# Patient Record
Sex: Female | Born: 1995 | Race: Black or African American | Hispanic: No | Marital: Single | Smoking: Former smoker
Health system: Southern US, Community
[De-identification: ages and names within clinical notes are randomized; demographics above are authoritative.]

## PROBLEM LIST (undated history)

## (undated) DIAGNOSIS — T7840XA Allergy, unspecified, initial encounter: Secondary | ICD-10-CM

## (undated) HISTORY — DX: Allergy, unspecified, initial encounter: T78.40XA

## (undated) HISTORY — PX: WISDOM TOOTH EXTRACTION: SHX21

---
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
## 2015-03-25 ENCOUNTER — Encounter (HOSPITAL_COMMUNITY): Payer: Self-pay

## 2015-03-25 ENCOUNTER — Other Ambulatory Visit (HOSPITAL_COMMUNITY)
Admission: RE | Admit: 2015-03-25 | Discharge: 2015-03-25 | Disposition: A | Discharge status: Home / Self Care | Payer: Medicaid Other | Source: Ambulatory Visit | Attending: Family Medicine | Admitting: Family Medicine

## 2015-03-25 ENCOUNTER — Emergency Department (INDEPENDENT_AMBULATORY_CARE_PROVIDER_SITE_OTHER)
Admission: EM | Admit: 2015-03-25 | Discharge: 2015-03-25 | Disposition: A | Discharge status: Home / Self Care | Payer: Medicaid Other | Source: Home / Self Care

## 2015-03-25 DIAGNOSIS — N760 Acute vaginitis: Secondary | ICD-10-CM | POA: Insufficient documentation

## 2015-03-25 DIAGNOSIS — Z113 Encounter for screening for infections with a predominantly sexual mode of transmission: Secondary | ICD-10-CM | POA: Insufficient documentation

## 2015-03-25 LAB — POCT URINALYSIS DIP (DEVICE)
Bilirubin Urine: NEGATIVE
Glucose, UA: NEGATIVE mg/dL
HGB URINE DIPSTICK: NEGATIVE
Ketones, ur: NEGATIVE mg/dL
NITRITE: NEGATIVE
PH: 6.5 (ref 5.0–8.0)
Protein, ur: NEGATIVE mg/dL
SPECIFIC GRAVITY, URINE: 1.02 (ref 1.005–1.030)
Urobilinogen, UA: 1 mg/dL (ref 0.0–1.0)

## 2015-03-25 LAB — POCT PREGNANCY, URINE: Preg Test, Ur: NEGATIVE

## 2015-03-25 MED ORDER — METRONIDAZOLE 500 MG PO TABS
500.0000 mg | ORAL_TABLET | Freq: Three times a day (TID) | ORAL | Status: DC
Start: 2015-03-25 — End: 2015-05-09

## 2015-03-25 NOTE — Discharge Instructions (Signed)

## 2015-03-25 NOTE — ED Notes (Signed)
Reported history of BV, " I think I have BV again". C/o vaginal d/c , itching. [facility]

## 2015-03-26 LAB — GC/CHLAMYDIA PROBE AMP (~~LOC~~) NOT AT ARMC
CHLAMYDIA, DNA PROBE: POSITIVE — AB
Neisseria Gonorrhea: NEGATIVE
Trichomonas: NEGATIVE

## 2015-03-26 LAB — CERVICOVAGINAL ANCILLARY ONLY: WET PREP (BD AFFIRM): POSITIVE — AB

## 2015-03-26 LAB — RPR: RPR: NONREACTIVE

## 2015-03-26 LAB — HIV ANTIBODY (ROUTINE TESTING W REFLEX): HIV Screen 4th Generation wRfx: NONREACTIVE

## 2015-03-26 NOTE — ED Notes (Signed)
Final report of HIV and RPR negative. Still waiting on GC, chlamydia reports

## 2015-03-26 NOTE — ED Notes (Addendum)
Final report of GC , chlamydia available for review. Positive for chlamydia, negative for GC.positive for gardnerella. Treatment for gardnerella adequate w rx provided day of [facility] visit Discussed w Dr Randal BubaErin Honig, who authorized Azithromycin 1 GM as a 1 x dose. Called patient , and after verifying ID discussed findings. Was advised she needs to contact her partner so they may also be treated and to avoid unprotected sex x 1 week after both have been treated to avoid re infection . Called to [facility] at corner of [address] [place] at patinet request. Spoke directly w pharmacy staff, form 2124 [employer] [facility]

## 2015-03-26 NOTE — ED Provider Notes (Signed)
CSN: [id number]     Arrival date & time 03/25/15  1303 History   None    Chief Complaint  Patient presents with  . Vaginitis   (Consider location/radiation/quality/duration/timing/severity/associated sxs/prior Treatment) HPI 20 year old female complaining of vaginal discharge. She states that she also has quite a bit of itching in the area. The patient's reported no previous history of STD in the past. States she may have been exposed to STD but has discharge similar to BV infection of the past. Past Medical History  Diagnosis Date  . Allergy    History reviewed. No pertinent past surgical history. Family History  Problem Relation Age of Onset  . Cancer Maternal Grandmother    Social History  Substance Use Topics  . Smoking status: Never Smoker   . Smokeless tobacco: Never Used  . Alcohol Use: 0.0 oz/week    0 Standard drinks or equivalent per week     Comment: occasional   OB History    Gravida Para Term Preterm AB TAB SAB Ectopic Multiple Living       Review of Systems ROS +'ve vaginal discharge  Denies: HEADACHE, NAUSEA, ABDOMINAL PAIN, CHEST PAIN, CONGESTION, DYSURIA, SHORTNESS OF BREATH  Allergies  Review of patient's allergies indicates no known allergies.  Home Medications   Prior to Admission medications   Medication Sig Start Date End Date Taking? Authorizing Provider  diphenhydrAMINE (SOMINEX) 25 MG tablet Take 25 mg by mouth at bedtime as needed for sleep.    Historical Provider, MD  ibuprofen (ADVIL,MOTRIN) 200 MG tablet Take 400 mg by mouth every 6 (six) hours as needed. FOR PAIN    Historical Provider, MD  Levonorgestrel (SKYLA) 13.5 MG IUD by Intrauterine route.    Historical Provider, MD  metroNIDAZOLE (FLAGYL) 500 MG tablet Take 1 tablet (500 mg total) by mouth 3 (three) times daily. 03/25/15   Tharon Aquas, PA  Multiple Vitamin (MULTIVITAMIN WITH MINERALS) TABS tablet Take 1 tablet by mouth daily.    Historical Provider, MD   norethindrone-ethinyl estradiol 1/35 (ORTHO-NOVUM 1/35, 28,) tablet Take 1 tablet by mouth daily. Patient not taking: Reported on 11/02/2014 09/13/14 09/13/15  Roe Coombs, CNM   Meds Ordered and Administered this Visit  Medications - No data to display  BP 123/81 mmHg  Pulse 67  Temp(Src) 98.1 F (36.7 C) (Oral)  Resp 18  SpO2 100% No data found.   Physical Exam  Constitutional: She appears well-developed and well-nourished.  HENT:  Head: Normocephalic and atraumatic.  Eyes: Conjunctivae are normal.  Pulmonary/Chest: Effort normal.  Abdominal: Soft. Bowel sounds are normal.  Genitourinary: Vaginal discharge found.  malodorous  Skin: Skin is warm and dry.  Psychiatric: She has a normal mood and affect. Her behavior is normal. Judgment and thought content normal.  Vitals reviewed.    ED Course  Procedures (including critical care time)  Labs Review Labs Reviewed  POCT URINALYSIS DIP (DEVICE) - Abnormal; Notable for the following:    Leukocytes, UA TRACE (*)    All other components within normal limits  GC/CHLAMYDIA PROBE AMP ([place]) NOT AT [facility] - Abnormal; Notable for the following:    Chlamydia **POSITIVE** (*)    All other components within normal limits  RPR  HIV ANTIBODY (ROUTINE TESTING)  POCT PREGNANCY, URINE  CERVICOVAGINAL ANCILLARY ONLY    Imaging Review No results found.   Visual Acuity Review  Right Eye Distance:   Left Eye Distance:  Bilateral Distance:    Right Eye Near:   Left Eye Near:    Bilateral Near:         MDM   1. Vaginitis    Treated for BV, UA is trace leuk without symptoms. STD cultures obtained. Since no direct history of STD will await culture results to treat.  Patient would also like HIV, syphilis done also. These tests have been requested.     Tharon AquasFrank C Patrick, PA 03/27/15 1052

## 2015-04-02 ENCOUNTER — Ambulatory Visit (INDEPENDENT_AMBULATORY_CARE_PROVIDER_SITE_OTHER): Payer: Medicaid Other | Admitting: Obstetrics

## 2015-04-02 DIAGNOSIS — Z202 Contact with and (suspected) exposure to infections with a predominantly sexual mode of transmission: Secondary | ICD-10-CM | POA: Diagnosis not present

## 2015-04-03 ENCOUNTER — Encounter: Payer: Self-pay | Admitting: Obstetrics

## 2015-04-03 NOTE — Progress Notes (Signed)
Patient ID: Sabrina CuffDebria Emily, female   DOB: [birth year], 19 y.o.   MRN: [id number]  Chief Complaint  Patient presents with  . Follow-up    TOC    HPI Sabrina Atkins is a 19 y.o. female.  H/O positive chlamydia, treated.  Presents today for F/U TOC cultures.  HPI  Past Medical History  Diagnosis Date  . Allergy     History reviewed. No pertinent past surgical history.  Family History  Problem Relation Age of Onset  . Cancer Maternal Grandmother     Social History Social History  Substance Use Topics  . Smoking status: Never Smoker   . Smokeless tobacco: Never Used  . Alcohol Use: 0.0 oz/week    0 Standard drinks or equivalent per week     Comment: occasional    No Known Allergies  Current Outpatient Prescriptions  Medication Sig Dispense Refill  . diphenhydrAMINE (SOMINEX) 25 MG tablet Take 25 mg by mouth at bedtime as needed for sleep.    Marland Kitchen. ibuprofen (ADVIL,MOTRIN) 200 MG tablet Take 400 mg by mouth every 6 (six) hours as needed. FOR PAIN    . Levonorgestrel (SKYLA) 13.5 MG IUD by Intrauterine route.    . metroNIDAZOLE (FLAGYL) 500 MG tablet Take 1 tablet (500 mg total) by mouth 3 (three) times daily. 21 tablet 0  . Multiple Vitamin (MULTIVITAMIN WITH MINERALS) TABS tablet Take 1 tablet by mouth daily.    . norethindrone-ethinyl estradiol 1/35 (ORTHO-NOVUM 1/35, 28,) tablet Take 1 tablet by mouth daily. (Patient not taking: Reported on 11/02/2014) 1 Package 11   No current facility-administered medications for this visit.    Review of Systems Review of Systems Constitutional: negative for fatigue and weight loss Respiratory: negative for cough and wheezing Cardiovascular: negative for chest pain, fatigue and palpitations Gastrointestinal: negative for abdominal pain and change in bowel habits Genitourinary:negative Integument/breast: negative for nipple discharge Musculoskeletal:negative for myalgias Neurological: negative for gait problems and  tremors Behavioral/Psych: negative for abusive relationship, depression Endocrine: negative for temperature intolerance     There were no vitals taken for this visit.  Physical Exam Physical Exam           General:  Alert and no distress. Abdomen:  normal findings: no organomegaly, soft, non-tender and no hernia  Pelvis:  External genitalia: normal general appearance Urinary system: urethral meatus normal and bladder without fullness, nontender Vaginal: normal without tenderness, induration or masses Cervix: normal appearance Adnexa: normal bimanual exam Uterus: anteverted and non-tender, normal size       Data Reviewed Labs  Assessment     Positive chlamydia.  Treated.      Plan    GC/Chlamydia cultures done F/U prn  No orders of the defined types were placed in this encounter.   No orders of the defined types were placed in this encounter.

## 2015-04-30 ENCOUNTER — Encounter: Payer: Self-pay | Admitting: Certified Nurse Midwife

## 2015-04-30 ENCOUNTER — Ambulatory Visit (INDEPENDENT_AMBULATORY_CARE_PROVIDER_SITE_OTHER): Payer: Medicaid Other | Admitting: Certified Nurse Midwife

## 2015-04-30 VITALS — BP 118/73 | HR 69 | Temp 98.1°F | Wt 140.0 lb

## 2015-04-30 DIAGNOSIS — Z30431 Encounter for routine checking of intrauterine contraceptive device: Secondary | ICD-10-CM

## 2015-04-30 DIAGNOSIS — Z8619 Personal history of other infectious and parasitic diseases: Secondary | ICD-10-CM | POA: Diagnosis not present

## 2015-04-30 NOTE — Progress Notes (Signed)
Patient ID: Sabrina Atkins, female   DOB: Sep 14, 1995, 19 y.o.   MRN: [id number]   Chief Complaint  Patient presents with  . Follow-up    TOC    HPI Sabrina Atkins is a 19 y.o. female.  Here for f/u on recent Chlamydia infection.  States took antibiotics and partner was treated.  Does state that she was having vaginal irritation with small bumps on the inside of the vaginal canal for about 2 weeks.  Did self treat for yeast infeciton and states that it is better.  Is on Skyla IUD and that her period started today.  Is happy with skyla.     HPI  Past Medical History  Diagnosis Date  . Allergy     No past surgical history on file.  Family History  Problem Relation Age of Onset  . Cancer Maternal Grandmother     Social History Social History  Substance Use Topics  . Smoking status: Never Smoker   . Smokeless tobacco: Never Used  . Alcohol Use: 0.0 oz/week    0 Standard drinks or equivalent per week     Comment: occasional    No Known Allergies  Current Outpatient Prescriptions  Medication Sig Dispense Refill  . Levonorgestrel (SKYLA) 13.5 MG IUD by Intrauterine route.    . diphenhydrAMINE (SOMINEX) 25 MG tablet Take 25 mg by mouth at bedtime as needed for sleep. Reported on 04/30/2015    . ibuprofen (ADVIL,MOTRIN) 200 MG tablet Take 400 mg by mouth every 6 (six) hours as needed. Reported on 04/30/2015    . metroNIDAZOLE (FLAGYL) 500 MG tablet Take 1 tablet (500 mg total) by mouth 3 (three) times daily. (Patient not taking: Reported on 04/30/2015) 21 tablet 0  . Multiple Vitamin (MULTIVITAMIN WITH MINERALS) TABS tablet Take 1 tablet by mouth daily. Reported on 04/30/2015    . norethindrone-ethinyl estradiol 1/35 (ORTHO-NOVUM 1/35, 28,) tablet Take 1 tablet by mouth daily. (Patient not taking: Reported on 11/02/2014) 1 Package 11   No current facility-administered medications for this visit.    Review of Systems Review of Systems Constitutional: negative for fatigue and weight  loss Respiratory: negative for cough and wheezing Cardiovascular: negative for chest pain, fatigue and palpitations Gastrointestinal: negative for abdominal pain and change in bowel habits Genitourinary: vaginal irriation Integument/breast: negative for nipple discharge Musculoskeletal:negative for myalgias Neurological: negative for gait problems and tremors Behavioral/Psych: negative for abusive relationship, depression Endocrine: negative for temperature intolerance     Blood pressure 118/73, pulse 69, temperature 98.1 F (36.7 C), weight 140 lb (63.504 kg), last menstrual period 04/30/2015.  Physical Exam Physical Exam General:   alert  Skin:   no rash or abnormalities  Lungs:   clear to auscultation bilaterally  Heart:   regular rate and rhythm, S1, S2 normal, no murmur, click, rub or gallop  Breasts:   deferred  Abdomen:  normal findings: no organomegaly, soft, non-tender and no hernia  Pelvis:  External genitalia: normal general appearance Urinary system: urethral meatus normal and bladder without fullness, nontender Vaginal: normal without tenderness, induration or masses Cervix: no cmt, strings present Adnexa: normal bimanual exam Uterus: anteverted and non-tender, normal size    50% of 15 min visit spent on counseling and coordination of care.   Data Reviewed Previous medical hx, meds  Assessment     [facility]       Plan    Orders Placed This Encounter  Procedures  . SureSwab, Vaginosis/Vaginitis Plus   No orders of  the defined types were placed in this encounter.     Follow up as needed.

## 2015-05-04 LAB — SURESWAB, VAGINOSIS/VAGINITIS PLUS
ATOPOBIUM VAGINAE: 7 Log (cells/mL)
C. GLABRATA, DNA: NOT DETECTED
C. TRACHOMATIS RNA, TMA: NOT DETECTED
C. albicans, DNA: NOT DETECTED
C. parapsilosis, DNA: NOT DETECTED
C. tropicalis, DNA: NOT DETECTED
LACTOBACILLUS SPECIES: NOT DETECTED Log (cells/mL)
N. GONORRHOEAE RNA, TMA: NOT DETECTED
T. vaginalis RNA, QL TMA: NOT DETECTED

## 2015-05-07 ENCOUNTER — Other Ambulatory Visit: Payer: Self-pay | Admitting: Certified Nurse Midwife

## 2015-05-07 DIAGNOSIS — B9689 Other specified bacterial agents as the cause of diseases classified elsewhere: Secondary | ICD-10-CM

## 2015-05-07 DIAGNOSIS — N760 Acute vaginitis: Principal | ICD-10-CM

## 2015-05-07 MED ORDER — TINIDAZOLE 500 MG PO TABS: 2.0000 g | ORAL_TABLET | Freq: Every day | ORAL | Status: DC

## 2015-05-09 ENCOUNTER — Other Ambulatory Visit: Payer: Self-pay | Admitting: *Deleted

## 2015-05-09 DIAGNOSIS — B9689 Other specified bacterial agents as the cause of diseases classified elsewhere: Secondary | ICD-10-CM

## 2015-05-09 DIAGNOSIS — N760 Acute vaginitis: Principal | ICD-10-CM

## 2015-05-09 MED ORDER — METRONIDAZOLE 500 MG PO TABS: 500.0000 mg | ORAL_TABLET | Freq: Two times a day (BID) | ORAL | Status: DC

## 2015-05-22 ENCOUNTER — Encounter (HOSPITAL_COMMUNITY): Payer: Self-pay | Admitting: Emergency Medicine

## 2015-05-22 ENCOUNTER — Other Ambulatory Visit (HOSPITAL_COMMUNITY)
Admission: RE | Admit: 2015-05-22 | Discharge: 2015-05-22 | Disposition: A | Discharge status: Home / Self Care | Payer: Medicaid Other | Source: Ambulatory Visit | Attending: Emergency Medicine | Admitting: Emergency Medicine

## 2015-05-22 ENCOUNTER — Emergency Department (INDEPENDENT_AMBULATORY_CARE_PROVIDER_SITE_OTHER)
Admission: EM | Admit: 2015-05-22 | Discharge: 2015-05-22 | Disposition: A | Discharge status: Home / Self Care | Payer: Medicaid Other | Source: Home / Self Care | Attending: Emergency Medicine | Admitting: Emergency Medicine

## 2015-05-22 DIAGNOSIS — N760 Acute vaginitis: Secondary | ICD-10-CM | POA: Insufficient documentation

## 2015-05-22 DIAGNOSIS — Z113 Encounter for screening for infections with a predominantly sexual mode of transmission: Secondary | ICD-10-CM

## 2015-05-22 DIAGNOSIS — A499 Bacterial infection, unspecified: Secondary | ICD-10-CM

## 2015-05-22 DIAGNOSIS — B9689 Other specified bacterial agents as the cause of diseases classified elsewhere: Secondary | ICD-10-CM

## 2015-05-22 LAB — POCT URINALYSIS DIP (DEVICE)
Bilirubin Urine: NEGATIVE
Glucose, UA: NEGATIVE mg/dL
HGB URINE DIPSTICK: NEGATIVE
Ketones, ur: NEGATIVE mg/dL
NITRITE: NEGATIVE
PH: 7.5 (ref 5.0–8.0)
PROTEIN: NEGATIVE mg/dL
Specific Gravity, Urine: 1.02 (ref 1.005–1.030)
Urobilinogen, UA: 0.2 mg/dL (ref 0.0–1.0)

## 2015-05-22 LAB — POCT PREGNANCY, URINE: Preg Test, Ur: NEGATIVE

## 2015-05-22 MED ORDER — CEFTRIAXONE SODIUM 250 MG IJ SOLR
250.0000 mg | Freq: Once | INTRAMUSCULAR | Status: AC
  Administered 2015-05-22: 250 mg via INTRAMUSCULAR

## 2015-05-22 MED ORDER — AZITHROMYCIN 250 MG PO TABS
ORAL_TABLET | ORAL | Status: AC
  Filled 2015-05-22: qty 4

## 2015-05-22 MED ORDER — METRONIDAZOLE 500 MG PO TABS: 500.0000 mg | ORAL_TABLET | Freq: Two times a day (BID) | ORAL | Status: DC

## 2015-05-22 MED ORDER — CEFTRIAXONE SODIUM 250 MG IJ SOLR
INTRAMUSCULAR | Status: AC
  Filled 2015-05-22: qty 250

## 2015-05-22 MED ORDER — AZITHROMYCIN 250 MG PO TABS
1000.0000 mg | ORAL_TABLET | Freq: Once | ORAL | Status: AC
  Administered 2015-05-22: 1000 mg via ORAL

## 2015-05-22 MED ORDER — LIDOCAINE HCL (PF) 1 % IJ SOLN
INTRAMUSCULAR | Status: AC
  Filled 2015-05-22: qty 5

## 2015-05-22 NOTE — Discharge Instructions (Signed)
Take the medication as written. Give us a working phone number so that we can contact you if needed. Refrain from sexual contact until you know your results and your partner(s) are treated. Return if you get worse, have a fever >100.4, or for any concerns.  ° °Go to www.goodrx.com to look up your medications. This will give you a list of where you can find your prescriptions at the most affordable prices.  °

## 2015-05-22 NOTE — ED Notes (Signed)
Patient is not ready for discharge-patient has orders for antibiotics

## 2015-05-22 NOTE — ED Provider Notes (Signed)
HPI  SUBJECTIVE:  Sabrina Atkins is a 20 y.o. female who presents with persistent vaginal itching, odor, vaginal discharge that has been present since 04/30/2015. Patient states that she was diagnosed with BV in December, but never picked up the prescription for this. She states that she did have a genital rash at first, but this has since resolved.  Reports some mild intermittent occasional lower back pain. No urgency, frequency, dysuria, oderous urine, hematuria,  genital blisters, vaginal itching. No aggravating, alleviating factors. Has not tried anything for this. No fevers, N/V, abd pain. No recent abx use. Pt sexually active with new female partner who is asxatic. States that this is not the sexual partner who gave her chlamydia. Does use condoms on a regular basis. STD's a concern today. Similar sx before when had BV. Also has a history of chlamydia. No history of gonorrhea Trichomonas, yeast infection. No h/o syphilis, herpes, HIV. No h/o DM.   Past medical history of BV, chlamydia treated on 03/2015. HIV, RPR negative in November 2016   Past Medical History  Diagnosis Date  . Allergy     History reviewed. No pertinent past surgical history.  Family History  Problem Relation Age of Onset  . Cancer Maternal Grandmother     Social History  Substance Use Topics  . Smoking status: Never Smoker   . Smokeless tobacco: Never Used  . Alcohol Use: 0.0 oz/week    0 Standard drinks or equivalent per week     Comment: occasional     Current facility-administered medications:  .  cefTRIAXone (ROCEPHIN) injection 250 mg, 250 mg, Intramuscular, Once, Domenick GongAshley Dorcas Melito, MD  Current outpatient prescriptions:  .  diphenhydrAMINE (SOMINEX) 25 MG tablet, Take 25 mg by mouth at bedtime as needed for sleep. Reported on 04/30/2015, Disp: , Rfl:  .  ibuprofen (ADVIL,MOTRIN) 200 MG tablet, Take 400 mg by mouth every 6 (six) hours as needed. Reported on 04/30/2015, Disp: , Rfl:  .  Levonorgestrel  (SKYLA) 13.5 MG IUD, by Intrauterine route., Disp: , Rfl:  .  metroNIDAZOLE (FLAGYL) 500 MG tablet, Take 1 tablet (500 mg total) by mouth 2 (two) times daily. X 7 days, Disp: 14 tablet, Rfl: 0 .  Multiple Vitamin (MULTIVITAMIN WITH MINERALS) TABS tablet, Take 1 tablet by mouth daily. Reported on 04/30/2015, Disp: , Rfl:  .  [DISCONTINUED] norethindrone-ethinyl estradiol 1/35 (ORTHO-NOVUM 1/35, 28,) tablet, Take 1 tablet by mouth daily. (Patient not taking: Reported on 11/02/2014), Disp: 1 Package, Rfl: 11  No Known Allergies   ROS  As noted in HPI.   Physical Exam  BP 109/69 mmHg  Pulse 72  Temp(Src) 98 F (36.7 C) (Oral)  Resp 16  SpO2 98%  LMP 04/30/2015  Constitutional: Well developed, well nourished, no acute distress Eyes:  EOMI, conjunctiva normal bilaterally HENT: Normocephalic, atraumatic,mucus membranes moist Respiratory: Normal inspiratory effort Cardiovascular: Normal rate GI: nondistended soft, nontender. No suprapubic tenderness  back: No CVA tenderness GU: External genitalia normal.  Normal vaginal mucosa.  Normal os.  thick  nonoderous  [place] vaginal discharge. + Purulent discharge from os.  Uterus smooth, NT. No CMT. No  adnexal tenderness. No adnexal masses.  Chaperone present during exam skin: No rash, skin intact Musculoskeletal: no deformities Neurologic: Alert & oriented x 3, no focal neuro deficits Psychiatric: Speech and behavior appropriate   ED Course   Medications  cefTRIAXone (ROCEPHIN) injection 250 mg (not administered)  azithromycin (ZITHROMAX) tablet 1,000 mg (1,000 mg Oral Given 05/22/15 1459)    Orders  Placed This Encounter  Procedures  . Pelvic exam    Standing Status: Standing     Number of Occurrences: 1     Standing Expiration Date:   . POCT urinalysis dip (device)    Standing Status: Standing     Number of Occurrences: 1     Standing Expiration Date:   . Pregnancy, urine POC    Standing Status: Standing     Number of  Occurrences: 1     Standing Expiration Date:     Results for orders placed or performed during the hospital encounter of 05/22/15 (from the past 24 hour(s))  POCT urinalysis dip (device)     Status: Abnormal   Collection Time: 05/22/15  1:47 PM  Result Value Ref Range   Glucose, UA NEGATIVE NEGATIVE mg/dL   Bilirubin Urine NEGATIVE NEGATIVE   Ketones, ur NEGATIVE NEGATIVE mg/dL   Specific Gravity, Urine 1.020 1.005 - 1.030   Hgb urine dipstick NEGATIVE NEGATIVE   pH 7.5 5.0 - 8.0   Protein, ur NEGATIVE NEGATIVE mg/dL   Urobilinogen, UA 0.2 0.0 - 1.0 mg/dL   Nitrite NEGATIVE NEGATIVE   Leukocytes, UA SMALL (A) NEGATIVE  Pregnancy, urine POC     Status: None   Collection Time: 05/22/15  1:55 PM  Result Value Ref Range   Preg Test, Ur NEGATIVE NEGATIVE   No results found.  ED Clinical Impression  BV (bacterial vaginosis)  Vaginitis  Screening for STD (sexually transmitted disease)   ED Assessment/Plan  H&P most c/w  BV. Sent off GC/chlamydia, wet prep. Will treat empirically now. Giving ceftriaxone 250 mg IM, azithro 1 gm po.. Will send home with flagyl . Advised pt to refrain from sexual contact until she  knows lab results, symptoms resolve, and partner(s) are treated if necessary. Pt provided working phone number. Pt agrees with plan.   Patient not pregnant. Small leukocyte esterase on UA, patient has no urinary symptoms. Will not tx.   Discussed labs, MDM, plan and followup with patient. Discussed sn/sx that should prompt return to the UC or ED. Patient agrees with plan.  *This clinic note was created using Dragon dictation software. Therefore, there may be occasional mistakes despite careful proofreading.  ?    Domenick Gong, MD 05/22/15 1501

## 2015-05-22 NOTE — ED Notes (Signed)
Patient concerned for bacterial vaginosis.  [facility]. Diagnosed with bv.  Did not purchase script for treatment because it was too expensive.  Reports itching, vaginal discharge, and odor.  Denies nausea, denies vomiting.  Intermittent abdominal pain, low back pain.  Intermittent painful urination

## 2015-05-23 LAB — CERVICOVAGINAL ANCILLARY ONLY
CHLAMYDIA, DNA PROBE: NEGATIVE
Neisseria Gonorrhea: NEGATIVE

## 2015-05-24 LAB — CERVICOVAGINAL ANCILLARY ONLY: WET PREP (BD AFFIRM): POSITIVE — AB

## 2015-05-25 ENCOUNTER — Telehealth (HOSPITAL_COMMUNITY): Payer: Self-pay | Admitting: Emergency Medicine

## 2015-05-25 ENCOUNTER — Encounter (HOSPITAL_COMMUNITY): Payer: Self-pay | Admitting: Emergency Medicine

## 2015-05-25 NOTE — ED Notes (Signed)
Called pt at [phone] to notify of resutls... VM has not been set up... Pt has been adequately treated for BV. Will try later to contact pt.

## 2015-05-27 NOTE — ED Notes (Signed)
Called pt... Notified her of recent lab results after being properly identified.... Pt continues to take Flagyl and reports sx have been alleviated... Adv pt to complete full course of treatment and to return if sx are not any better... Pt verb understanding.

## 2015-07-15 ENCOUNTER — Emergency Department (HOSPITAL_COMMUNITY)
Admission: EM | Admit: 2015-07-15 | Discharge: 2015-07-15 | Disposition: A | Discharge status: Home / Self Care | Payer: Medicaid Other | Attending: Emergency Medicine | Admitting: Emergency Medicine

## 2015-07-15 ENCOUNTER — Encounter (HOSPITAL_COMMUNITY): Payer: Self-pay | Admitting: Emergency Medicine

## 2015-07-15 DIAGNOSIS — N3090 Cystitis, unspecified without hematuria: Secondary | ICD-10-CM

## 2015-07-15 DIAGNOSIS — R300 Dysuria: Secondary | ICD-10-CM | POA: Diagnosis present

## 2015-07-15 LAB — URINE MICROSCOPIC-ADD ON

## 2015-07-15 LAB — URINALYSIS, ROUTINE W REFLEX MICROSCOPIC
Bilirubin Urine: NEGATIVE
Glucose, UA: NEGATIVE mg/dL
Ketones, ur: NEGATIVE mg/dL
NITRITE: NEGATIVE
PROTEIN: NEGATIVE mg/dL
SPECIFIC GRAVITY, URINE: 1.007 (ref 1.005–1.030)
pH: 6.5 (ref 5.0–8.0)

## 2015-07-15 MED ORDER — NITROFURANTOIN MONOHYD MACRO 100 MG PO CAPS: 100.0000 mg | ORAL_CAPSULE | Freq: Two times a day (BID) | ORAL | Status: DC

## 2015-07-15 MED ORDER — PHENAZOPYRIDINE HCL 200 MG PO TABS: 200.0000 mg | ORAL_TABLET | Freq: Three times a day (TID) | ORAL | Status: DC

## 2015-07-15 NOTE — ED Provider Notes (Signed)
CSN: [id number]     Arrival date & time 07/15/15  0930 History   First MD Initiated Contact with Patient 07/15/15 1121     Chief Complaint  Patient presents with  . Dysuria     (Consider location/radiation/quality/duration/timing/severity/associated sxs/prior Treatment) HPI Comments: Pt comes   Patient is a 20 y.o. female presenting with dysuria. The history is provided by the patient.  Dysuria Pain quality:  Burning Pain severity:  Moderate Onset quality:  Gradual Duration:  3 days Timing:  Constant Progression:  Worsening Chronicity:  New Recent urinary tract infections: no   Ineffective treatments:  None tried Urinary symptoms: frequent urination   Associated symptoms: abdominal pain   Associated symptoms: no fever, no flank pain, no nausea, no vaginal discharge and no vomiting     Past Medical History  Diagnosis Date  . Allergy    No past surgical history on file. Family History  Problem Relation Age of Onset  . Cancer Maternal Grandmother    Social History  Substance Use Topics  . Smoking status: Never Smoker   . Smokeless tobacco: Never Used  . Alcohol Use: 0.0 oz/week    0 Standard drinks or equivalent per week     Comment: occasional   OB History    Gravida Para Term Preterm AB TAB SAB Ectopic Multiple Living       Review of Systems  Constitutional: Negative for fever.  Gastrointestinal: Positive for abdominal pain. Negative for nausea and vomiting.  Genitourinary: Positive for dysuria. Negative for flank pain and vaginal discharge.  Allergic/Immunologic: Negative for immunocompromised state.      Allergies  Review of patient's allergies indicates no known allergies.  Home Medications   Prior to Admission medications   Medication Sig Start Date End Date Taking? Authorizing Provider  Levonorgestrel (SKYLA) 13.5 MG IUD by Intrauterine route. Placed 09/2014   Yes Historical Provider, MD  Pseudoephedrine-APAP-DM (TYLENOL  COLD/FLU SEVERE DAY PO) Take 10 mLs by mouth every 4 (four) hours as needed (for cold).   Yes Historical Provider, MD  metroNIDAZOLE (FLAGYL) 500 MG tablet Take 1 tablet (500 mg total) by mouth 2 (two) times daily. X 7 days Patient not taking: Reported on 07/15/2015 05/22/15   Domenick Gong, MD  nitrofurantoin, macrocrystal-monohydrate, (MACROBID) 100 MG capsule Take 1 capsule (100 mg total) by mouth 2 (two) times daily. 07/15/15   Derwood Kaplan, MD  phenazopyridine (PYRIDIUM) 200 MG tablet Take 1 tablet (200 mg total) by mouth 3 (three) times daily. 07/15/15   Katsumi Wisler, MD   BP 120/72 mmHg  Pulse 81  Temp(Src) 98.7 F (37.1 C) (Oral)  Resp 16  SpO2 100% Physical Exam  Constitutional: She is oriented to person, place, and time. She appears well-developed.  HENT:  Head: Normocephalic and atraumatic.  Eyes: EOM are normal.  Neck: Normal range of motion. Neck supple.  Cardiovascular: Normal rate.   Pulmonary/Chest: Effort normal.  Abdominal: Soft. Bowel sounds are normal. There is no tenderness.  Neurological: She is alert and oriented to person, place, and time.  Skin: Skin is warm and dry.  Nursing note and vitals reviewed.   ED Course  Procedures (including critical care time) Labs Review Labs Reviewed  URINALYSIS, ROUTINE W REFLEX MICROSCOPIC (NOT AT [facility]) - Abnormal; Notable for the following:    APPearance CLOUDY (*)    Hgb urine dipstick MODERATE (*)    Leukocytes, UA LARGE (*)    All other components  within normal limits  URINE MICROSCOPIC-ADD ON - Abnormal; Notable for the following:    Squamous Epithelial / LPF 6-30 (*)    Bacteria, UA MANY (*)    All other components within normal limits    Imaging Review No results found. I have personally reviewed and evaluated these images and lab results as part of my medical decision-making.   EKG Interpretation None      MDM   Final diagnoses:  Cystitis    PT comes in with cc of dysuria. UA is +. She has no  vaginal discharge, pelvic pain. Will tx as a UTI.    Derwood KaplanAnkit Skylor Hughson, MD 07/16/15 [phone]

## 2015-07-15 NOTE — ED Notes (Signed)
Awake. Verbally responsive. A/O x4. Resp even and unlabored. No audible adventitious breath sounds noted. ABC's intact.  

## 2015-07-15 NOTE — Discharge Instructions (Signed)

## 2015-07-15 NOTE — ED Notes (Addendum)
Pt c/o burning upon urination x 3 days.  Hx of UTI and states it feels the same.  Pt states that she has not had any NVD.  No vaginal discharge.

## 2015-11-05 ENCOUNTER — Ambulatory Visit (INDEPENDENT_AMBULATORY_CARE_PROVIDER_SITE_OTHER): Payer: Medicaid Other | Admitting: Certified Nurse Midwife

## 2015-11-05 ENCOUNTER — Encounter: Payer: Self-pay | Admitting: Certified Nurse Midwife

## 2015-11-05 VITALS — BP 116/67 | HR 69 | Wt 146.0 lb

## 2015-11-05 DIAGNOSIS — Z01419 Encounter for gynecological examination (general) (routine) without abnormal findings: Secondary | ICD-10-CM

## 2015-11-05 DIAGNOSIS — Z0000 Encounter for general adult medical examination without abnormal findings: Secondary | ICD-10-CM | POA: Diagnosis not present

## 2015-11-05 DIAGNOSIS — Z1389 Encounter for screening for other disorder: Secondary | ICD-10-CM | POA: Diagnosis not present

## 2015-11-05 LAB — POCT URINALYSIS DIPSTICK
Bilirubin, UA: NEGATIVE
Blood, UA: NEGATIVE
GLUCOSE UA: NEGATIVE
Ketones, UA: NEGATIVE
NITRITE UA: NEGATIVE
PROTEIN UA: NEGATIVE
Spec Grav, UA: 1.01
UROBILINOGEN UA: NEGATIVE
pH, UA: 5

## 2015-11-05 NOTE — Progress Notes (Signed)
Patient ID: Sabrina Atkins, female   DOB: February 28, 1996, 20 y.o.   MRN: [id number]   Subjective:        Sabrina Atkins is a 20 y.o. female here for a routine exam.  Current complaints: No complaints.  Reports having 2 cycles per month on Skyla, 3 days each.  Reports liking IUD better than Nexplanon.  Currently sexually active.  Denies need for STD screening.  Personal health questionnaire:  Is patient Ashkenazi Jewish, have a family history of breast and/or ovarian cancer: no Is there a family history of uterine cancer diagnosed at age < 1750, gastrointestinal cancer, urinary tract cancer, family member who is a Personnel officerLynch syndrome-associated carrier: no Is the patient overweight and hypertensive, family history of diabetes, personal history of gestational diabetes, preeclampsia or PCOS: no Is patient over 1555, have PCOS,  family history of premature CHD under age 20, diabetes, smoke, have hypertension or peripheral artery disease:  no At any time, has a partner hit, kicked or otherwise hurt or frightened you?: no Over the past 2 weeks, have you felt down, depressed or hopeless?: no Over the past 2 weeks, have you felt little interest or pleasure in doing things?:no  Breast CA hx: maternal grandmother.  Gynecologic History No LMP recorded. Contraception: IUD Last Pap: unknown. Results were: normal Last mammogram: N/A. Results were: N/A  Obstetric History OB History  Gravida Para Term Preterm AB SAB TAB Ectopic Multiple Living  0 0 0 0 0 0 0 0 0 0         Past Medical History  Diagnosis Date  . Allergy     No past surgical history on file.   Current outpatient prescriptions:  .  Levonorgestrel (SKYLA) 13.5 MG IUD, by Intrauterine route. Placed 09/2014, Disp: , Rfl:  .  Pseudoephedrine-APAP-DM (TYLENOL COLD/FLU SEVERE DAY PO), Take 10 mLs by mouth every 4 (four) hours as needed (for cold)., Disp: , Rfl:  .  [DISCONTINUED] norethindrone-ethinyl estradiol 1/35 (ORTHO-NOVUM 1/35, 28,) tablet,  Take 1 tablet by mouth daily. (Patient not taking: Reported on 11/02/2014), Disp: 1 Package, Rfl: 11 No Known Allergies  Social History  Substance Use Topics  . Smoking status: Never Smoker   . Smokeless tobacco: Never Used  . Alcohol Use: 0.0 oz/week    0 Standard drinks or equivalent per week     Comment: occasional    Family History  Problem Relation Age of Onset  . Cancer Maternal Grandmother       Review of Systems  Constitutional: negative for fatigue and weight loss Respiratory: negative for cough and wheezing Cardiovascular: negative for chest pain, fatigue and palpitations Gastrointestinal: negative for abdominal pain and change in bowel habits Musculoskeletal:negative for myalgias Neurological: negative for gait problems and tremors Behavioral/Psych: negative for abusive relationship, depression Endocrine: negative for temperature intolerance   Genitourinary:negative for abnormal menstrual periods, genital lesions, hot flashes, sexual problems and vaginal discharge Integument/breast: negative for breast lump, breast tenderness, nipple discharge and skin lesion(s)    Objective:       BP 116/67 mmHg  Pulse 69  Wt 146 lb (66.225 kg) General:   alert  Skin:   no rash or abnormalities  Lungs:   clear to auscultation bilaterally  Heart:   regular rate and rhythm, S1, S2 normal, no murmur, click, rub or gallop  Breasts:   normal without suspicious masses, skin or nipple changes or axillary nodes  Abdomen:  normal findings: no organomegaly, soft, non-tender and no hernia  Pelvis:  External genitalia:  normal general appearance Urinary system: urethral meatus normal and bladder without fullness, nontender Vaginal: normal without tenderness, induration or masses Cervix: normal appearance Adnexa: normal bimanual exam Uterus: anteverted and non-tender, normal size   Lab Review Urine pregnancy test Labs reviewed yes Radiologic studies reviewed no  50% of 30 min visit  spent on counseling and coordination of care.   Assessment:    Healthy female exam.    IUD strings palpated.  Plan:    Education reviewed: depression evaluation, low fat, low cholesterol diet, safe sex/STD prevention, self breast exams and weight bearing exercise. Contraception: IUD. Follow up in: 1 year.   No orders of the defined types were placed in this encounter.   Orders Placed This Encounter  Procedures  . POCT urinalysis dipstick   Need to obtain previous records Possible management options include: healthy diet & exercise, continue IUD Follow up as needed.

## 2015-11-08 ENCOUNTER — Other Ambulatory Visit (HOSPITAL_COMMUNITY): Payer: Self-pay | Admitting: Certified Nurse Midwife

## 2015-11-08 DIAGNOSIS — B9689 Other specified bacterial agents as the cause of diseases classified elsewhere: Secondary | ICD-10-CM

## 2015-11-08 DIAGNOSIS — N760 Acute vaginitis: Principal | ICD-10-CM

## 2015-11-08 MED ORDER — METRONIDAZOLE 500 MG PO TABS: 500.0000 mg | ORAL_TABLET | Freq: Two times a day (BID) | ORAL | Status: DC

## 2015-11-10 LAB — NUSWAB VG+, CANDIDA 6SP
Atopobium vaginae: HIGH Score — AB
BVAB 2: HIGH Score — AB
CANDIDA LUSITANIAE, NAA: NEGATIVE
CANDIDA PARAPSILOSIS, NAA: NEGATIVE
CHLAMYDIA TRACHOMATIS, NAA: POSITIVE — AB
Candida albicans, NAA: NEGATIVE
Candida glabrata, NAA: NEGATIVE
Candida krusei, NAA: NEGATIVE
Candida tropicalis, NAA: NEGATIVE
Megasphaera 1: HIGH Score — AB
Neisseria gonorrhoeae, NAA: NEGATIVE
TRICH VAG BY NAA: NEGATIVE

## 2015-11-13 ENCOUNTER — Other Ambulatory Visit (HOSPITAL_COMMUNITY): Payer: Self-pay | Admitting: Certified Nurse Midwife

## 2015-11-13 DIAGNOSIS — A749 Chlamydial infection, unspecified: Secondary | ICD-10-CM

## 2015-11-13 MED ORDER — AZITHROMYCIN 250 MG PO TABS: ORAL_TABLET | ORAL | Status: DC

## 2015-12-06 ENCOUNTER — Ambulatory Visit (HOSPITAL_COMMUNITY)
Admission: EM | Admit: 2015-12-06 | Discharge: 2015-12-06 | Disposition: A | Discharge status: Home / Self Care | Payer: Medicaid Other | Attending: Emergency Medicine | Admitting: Emergency Medicine

## 2015-12-06 ENCOUNTER — Encounter (HOSPITAL_COMMUNITY): Payer: Self-pay | Admitting: *Deleted

## 2015-12-06 DIAGNOSIS — Z202 Contact with and (suspected) exposure to infections with a predominantly sexual mode of transmission: Secondary | ICD-10-CM | POA: Diagnosis not present

## 2015-12-06 MED ORDER — CEFTRIAXONE SODIUM 250 MG IJ SOLR
INTRAMUSCULAR | Status: AC
  Filled 2015-12-06: qty 250

## 2015-12-06 MED ORDER — AZITHROMYCIN 250 MG PO TABS
ORAL_TABLET | ORAL | Status: AC
  Filled 2015-12-06: qty 4

## 2015-12-06 MED ORDER — CEFTRIAXONE SODIUM 250 MG IJ SOLR
250.0000 mg | Freq: Once | INTRAMUSCULAR | Status: AC
  Administered 2015-12-06: 250 mg via INTRAMUSCULAR

## 2015-12-06 MED ORDER — AZITHROMYCIN 250 MG PO TABS
1000.0000 mg | ORAL_TABLET | Freq: Once | ORAL | Status: AC
  Administered 2015-12-06: 1000 mg via ORAL

## 2015-12-06 NOTE — ED Provider Notes (Signed)
[facility]    CSN: [id number] Arrival date & time: 12/06/15  7342  First Provider Contact:  None       History   Chief Complaint Chief Complaint  Patient presents with  . Exposure to STD    HPI Sabrina Atkins is a 20 y.o. female.   A 19 year old woman here for evaluation of STD. She states her partner told her he tested positive for chlamydia. She had unprotected sex with him a few days ago. She does have the [facility] for birth control.  She denies any vaginal discharge or discomfort. No abdominal pain. No fevers or chills. She reports very mild and intermittent itching, but this is not consistent. No history of STDs.    Past Medical History:  Diagnosis Date  . Allergy     Patient Active Problem List   Diagnosis Date Noted  . IUD check up 04/30/2015  . General counseling and advice on female contraception 03/24/2013    History reviewed. No pertinent surgical history.  OB History    Gravida Para Term Preterm AB Living   0 0 0 0 0 0   SAB TAB Ectopic Multiple Live Births   0 0 0 0         Home Medications    Prior to Admission medications   Medication Sig Start Date End Date Taking? Authorizing Provider  Levonorgestrel (SKYLA) 13.5 MG IUD by Intrauterine route. Placed 09/2014    Historical Provider, MD    Family History Family History  Problem Relation Age of Onset  . Cancer Maternal Grandmother     Social History Social History  Substance Use Topics  . Smoking status: Never Smoker  . Smokeless tobacco: Never Used  . Alcohol use 0.0 oz/week     Comment: occasional     Allergies   Review of patient's allergies indicates no known allergies.   Review of Systems Review of Systems  Constitutional: Negative for fever.  Gastrointestinal: Negative for abdominal pain.  Genitourinary: Negative for dysuria, genital sores, pelvic pain, vaginal discharge and vaginal pain.     Physical Exam Triage Vital Signs ED Triage Vitals  Enc Vitals  Group     BP      Pulse      Resp      Temp      Temp src      SpO2      Weight      Height      Head Circumference      Peak Flow      Pain Score      Pain Loc      Pain Edu?      Excl. in GC?    No data found.   Updated Vital Signs BP 120/80 (BP Location: Right Arm)   Pulse 71   Temp 98 F (36.7 C)   Resp 16   SpO2 100%   Visual Acuity Right Eye Distance:   Left Eye Distance:   Bilateral Distance:    Right Eye Near:   Left Eye Near:    Bilateral Near:     Physical Exam  Constitutional: She is oriented to person, place, and time. She appears well-developed and well-nourished. No distress.  Cardiovascular: Normal rate.   Pulmonary/Chest: Effort normal.  Genitourinary:  Genitourinary Comments: Deferred given lack of symptoms  Neurological: She is alert and oriented to person, place, and time.     UC Treatments / Results  Labs (all labs ordered are listed,  but only abnormal results are displayed) Labs Reviewed  HIV ANTIBODY (ROUTINE TESTING)  RPR  URINE CYTOLOGY ANCILLARY ONLY    EKG  EKG Interpretation None       Radiology No results found.  Procedures Procedures (including critical care time)  Medications Ordered in UC Medications  azithromycin (ZITHROMAX) tablet 1,000 mg (1,000 mg Oral Given 12/06/15 1050)  cefTRIAXone (ROCEPHIN) injection 250 mg (250 mg Intramuscular Given 12/06/15 1050)     Initial Impression / Assessment and Plan / UC Course  I have reviewed the triage vital signs and the nursing notes.  Pertinent labs & imaging results that were available during my care of the patient were reviewed by me and considered in my medical decision making (see chart for details).  Clinical Course    Will treat with Rocephin and azithromycin here. Blood and urine collected for STD testing. We'll call with the results. Follow-up as needed.  Final Clinical Impressions(s) / UC Diagnoses   Final diagnoses:  STD exposure    New  Prescriptions Discharge Medication List as of 12/06/2015 10:36 AM       Charm Rings, MD 12/06/15 1053

## 2015-12-06 NOTE — ED Triage Notes (Signed)
Patient reports her significant other tested positive for chlamydia. Patient denies any signs or symptoms.

## 2015-12-06 NOTE — Discharge Instructions (Signed)
We have sent blood and urine off for STD testing. We will call you with the results in 2-3 days. We went ahead and treated you for chlamydia today. No sex for one week. Please use condoms as this is the only way to protect against STDs. Follow-up as needed.

## 2015-12-07 LAB — HIV ANTIBODY (ROUTINE TESTING W REFLEX): HIV SCREEN 4TH GENERATION: NONREACTIVE

## 2015-12-07 LAB — RPR: RPR: NONREACTIVE

## 2015-12-09 LAB — URINE CYTOLOGY ANCILLARY ONLY
Chlamydia: POSITIVE — AB
Neisseria Gonorrhea: NEGATIVE
TRICH (WINDOWPATH): NEGATIVE

## 2015-12-13 ENCOUNTER — Ambulatory Visit: Payer: Medicaid Other | Admitting: Certified Nurse Midwife

## 2015-12-14 ENCOUNTER — Telehealth (HOSPITAL_COMMUNITY): Payer: Self-pay | Admitting: Emergency Medicine

## 2015-12-14 NOTE — Telephone Encounter (Signed)
Verified identity.  Notified patient she was positive for chlamydia and did receive treatment while in department.  Instructed to notify partner(s).

## 2015-12-14 NOTE — Telephone Encounter (Signed)
-----   Message from Eustace Moore, MD sent at 12/12/2015  8:10 AM EDT ----- Please let patient and health department know that test for chlamydia was positive.  Patient was treated at the [facility] visit 12/06/15 with po zithromax 1g.   Sexual partners need to be notified and tested/treated.   Recheck as needed.  Note sent to patient's MyChart.  Ria Clock MD

## 2016-08-12 IMAGING — CR DG CHEST 2V
2 series · 2 of 2 positions shown · non-contrast
Comparison: None.

CLINICAL DATA: Chest pain and dizziness

EXAM:
CHEST  2 VIEW

[w chest pa]
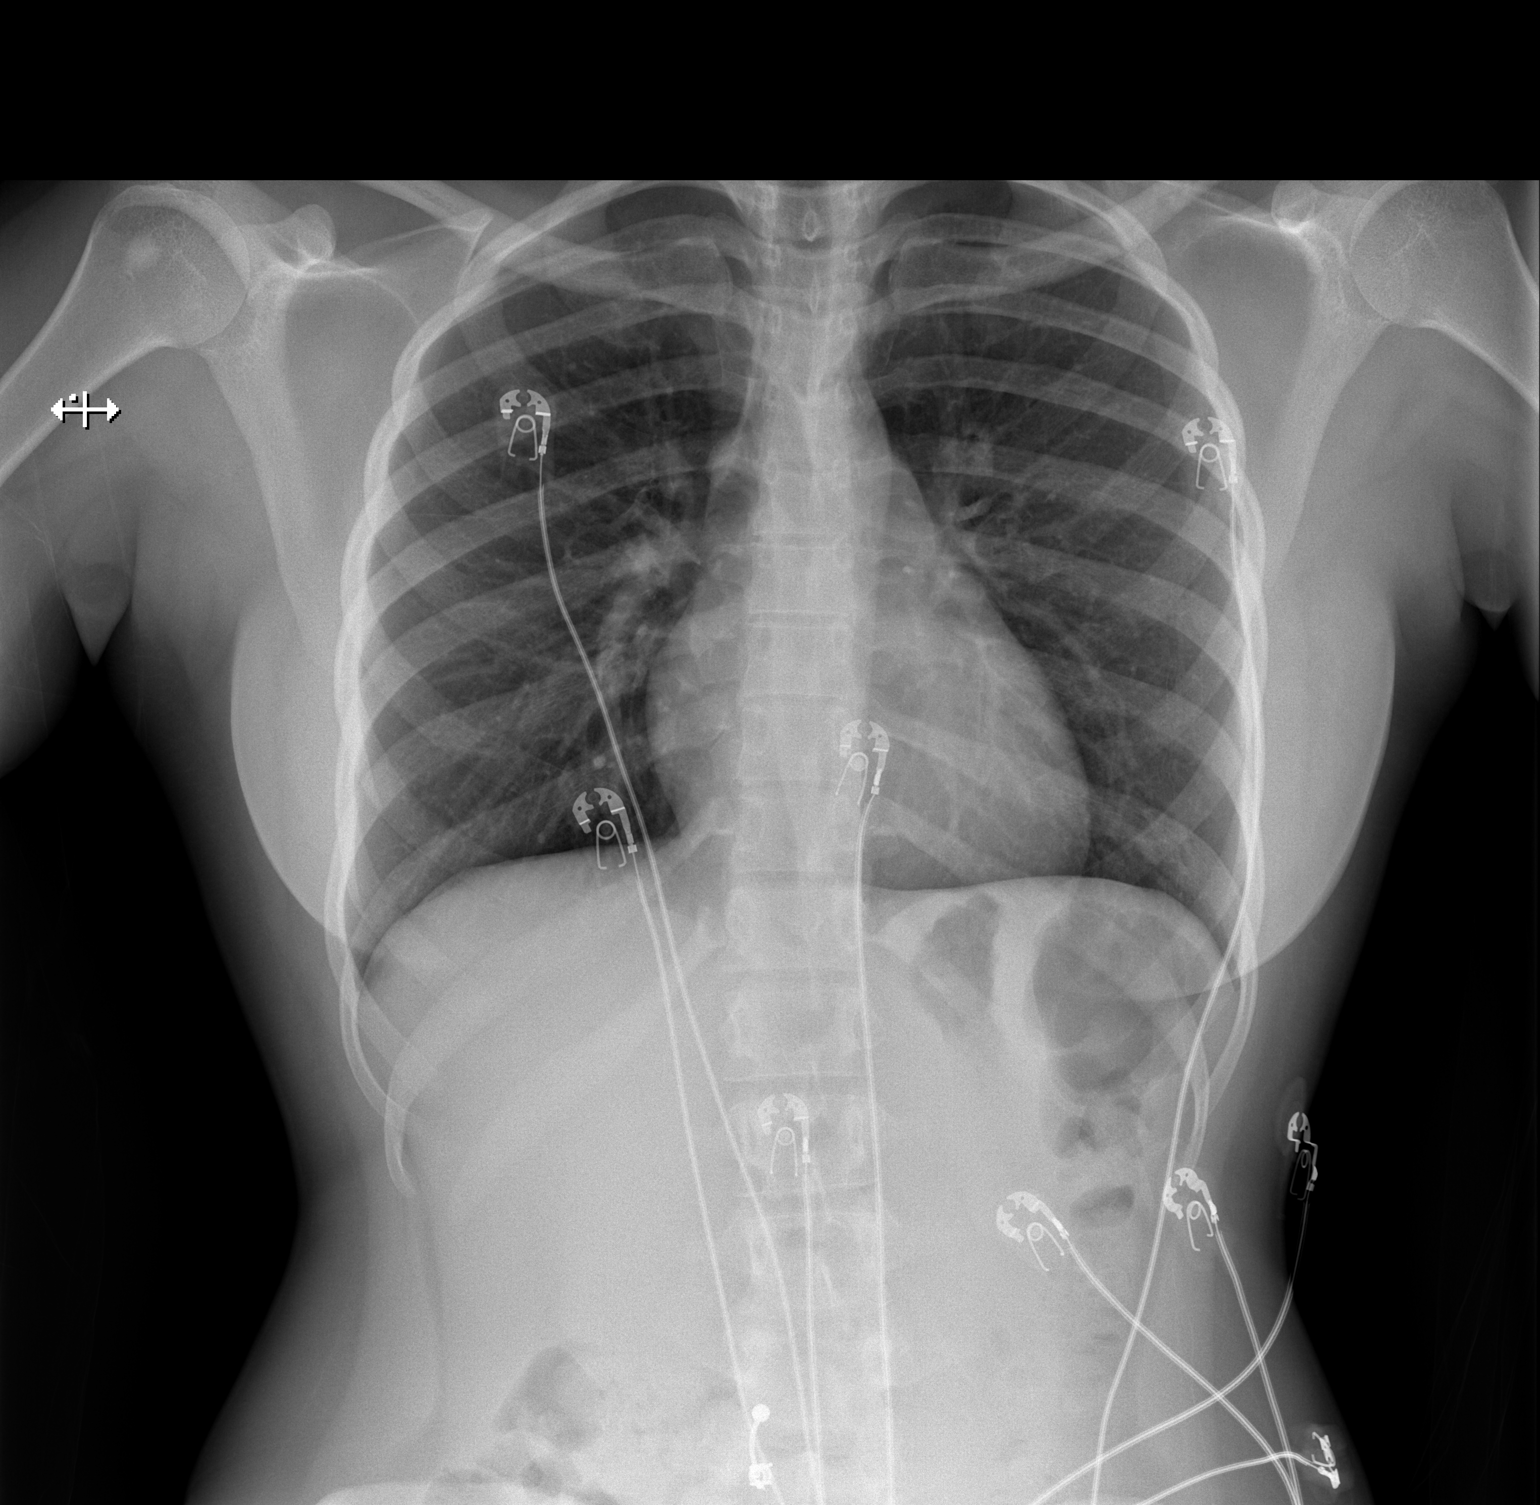

[w chest lat]
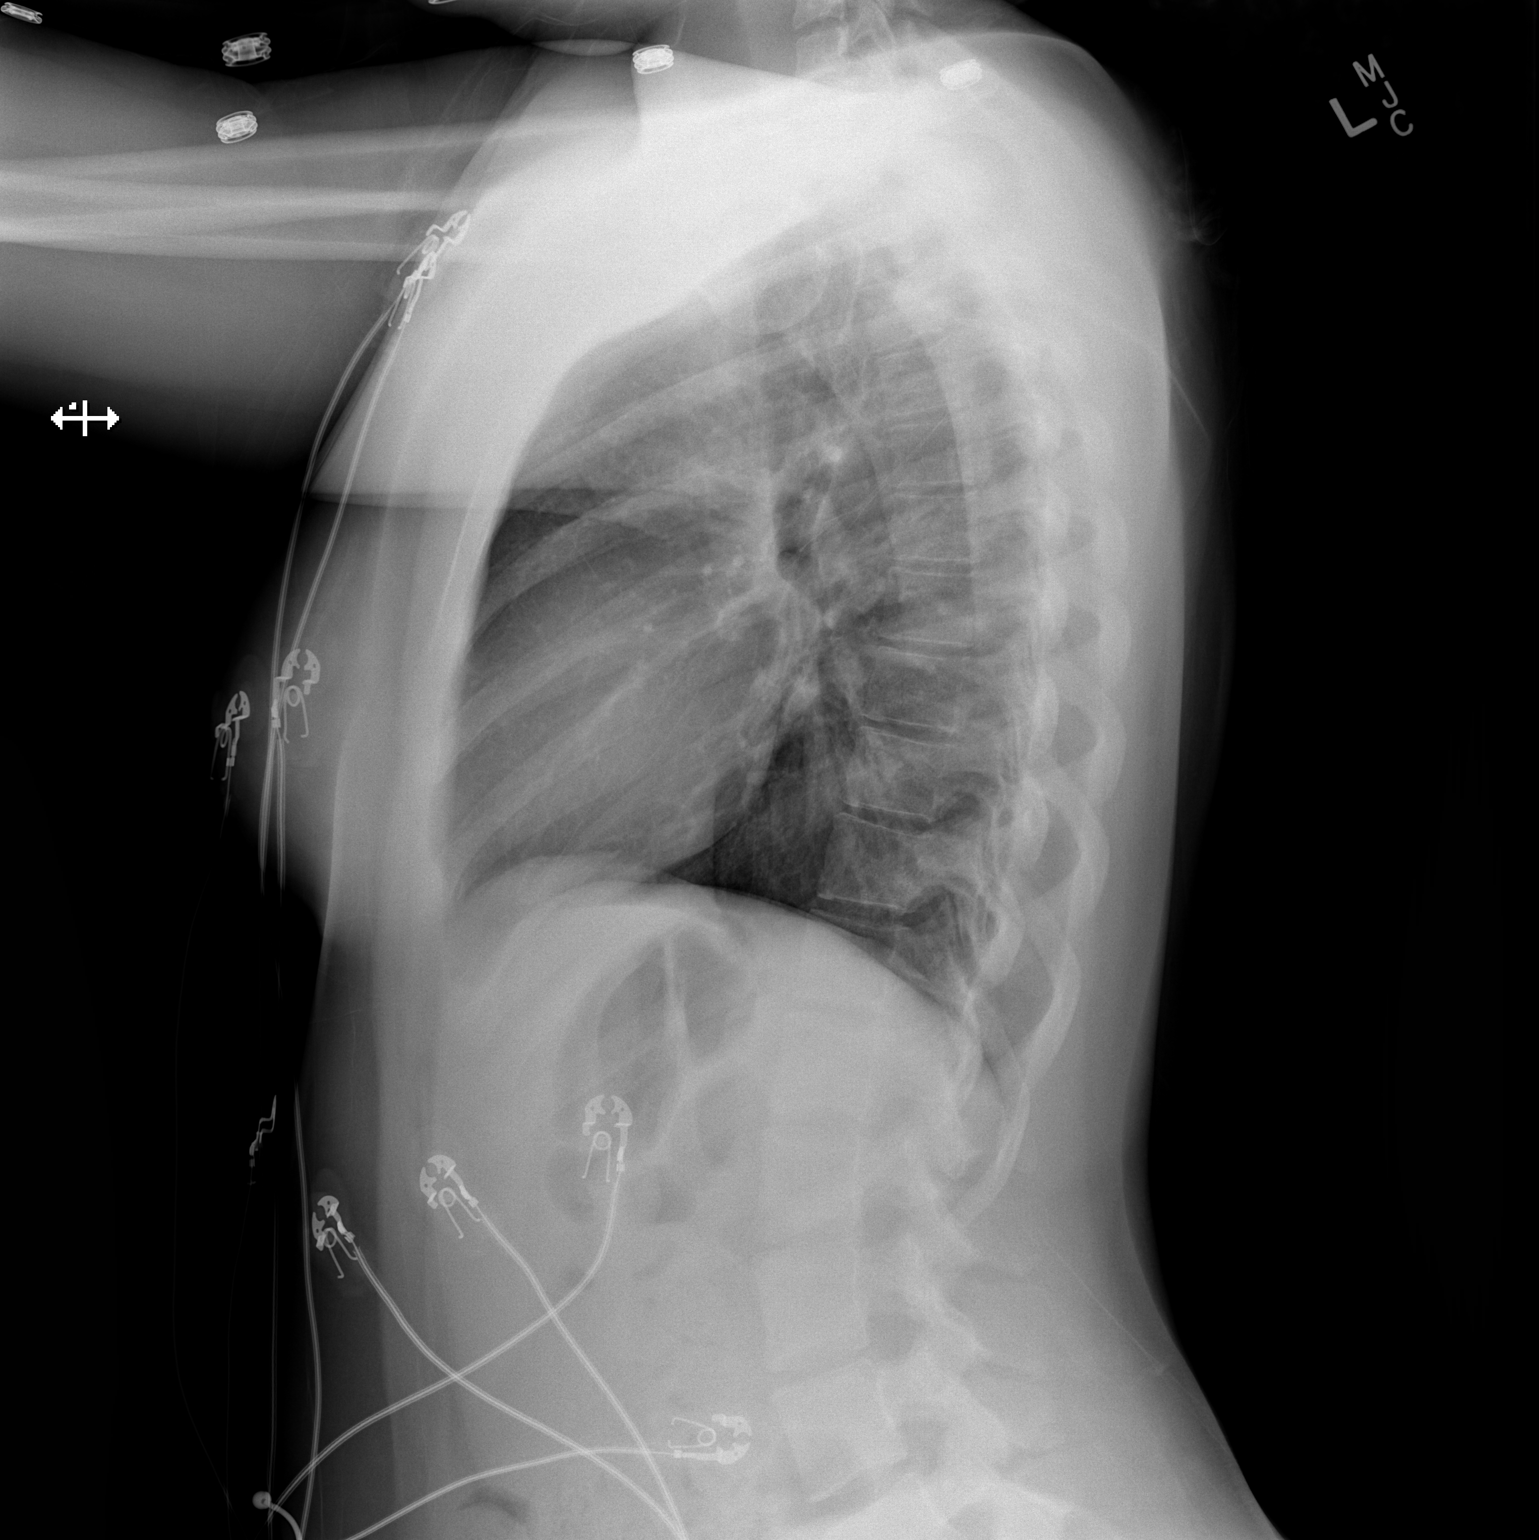

[2 of 2 positions shown; findings below may reference images not displayed]

FINDINGS: Lungs are clear. Heart size and pulmonary vascularity are normal. No
adenopathy. There is a probable bone island in the right humeral
head region.
IMPRESSION: No edema or consolidation.

## 2017-02-02 ENCOUNTER — Encounter: Payer: Self-pay | Admitting: Certified Nurse Midwife

## 2017-02-02 ENCOUNTER — Ambulatory Visit (INDEPENDENT_AMBULATORY_CARE_PROVIDER_SITE_OTHER): Payer: Medicaid Other | Admitting: Certified Nurse Midwife

## 2017-02-02 ENCOUNTER — Other Ambulatory Visit (HOSPITAL_COMMUNITY)
Admission: RE | Admit: 2017-02-02 | Discharge: 2017-02-02 | Disposition: A | Discharge status: Home / Self Care | Payer: Medicaid Other | Source: Ambulatory Visit | Attending: Certified Nurse Midwife | Admitting: Certified Nurse Midwife

## 2017-02-02 VITALS — BP 123/83 | HR 61 | Ht 61.0 in | Wt 159.6 lb

## 2017-02-02 DIAGNOSIS — Z113 Encounter for screening for infections with a predominantly sexual mode of transmission: Secondary | ICD-10-CM | POA: Insufficient documentation

## 2017-02-02 DIAGNOSIS — Z8619 Personal history of other infectious and parasitic diseases: Secondary | ICD-10-CM

## 2017-02-02 DIAGNOSIS — Z01419 Encounter for gynecological examination (general) (routine) without abnormal findings: Secondary | ICD-10-CM

## 2017-02-02 DIAGNOSIS — Z09 Encounter for follow-up examination after completed treatment for conditions other than malignant neoplasm: Secondary | ICD-10-CM | POA: Insufficient documentation

## 2017-02-02 DIAGNOSIS — Z30431 Encounter for routine checking of intrauterine contraceptive device: Secondary | ICD-10-CM

## 2017-02-02 DIAGNOSIS — Z01411 Encounter for gynecological examination (general) (routine) with abnormal findings: Secondary | ICD-10-CM | POA: Insufficient documentation

## 2017-02-02 DIAGNOSIS — Z309 Encounter for contraceptive management, unspecified: Secondary | ICD-10-CM | POA: Diagnosis not present

## 2017-02-02 DIAGNOSIS — R87810 Cervical high risk human papillomavirus (HPV) DNA test positive: Secondary | ICD-10-CM | POA: Insufficient documentation

## 2017-02-02 NOTE — Progress Notes (Signed)
Subjective:        Sabrina Atkins is a 21 y.o. female here for a routine exam.  Reports having 1 cycle per month on Skyla, 3-4 days each.  Reports liking IUD better than Nexplanon.  Currently sexually active.  Denies need for STD screening.  Did have +CH 12/06/15 and was treated.  Discussed Kyleena IUD next time.    Personal health questionnaire:  Is patient Ashkenazi Jewish, have a family history of breast and/or ovarian cancer: no Is there a family history of uterine cancer diagnosed at age < 42, gastrointestinal cancer, urinary tract cancer, family member who is a Personnel officer syndrome-associated carrier: no Is the patient overweight and hypertensive, family history of diabetes, personal history of gestational diabetes, preeclampsia or PCOS: no Is patient over 67, have PCOS,  family history of premature CHD under age 23, diabetes, smoke, have hypertension or peripheral artery disease:  no At any time, has a partner hit, kicked or otherwise hurt or frightened you?: no Over the past 2 weeks, have you felt down, depressed or hopeless?: no Over the past 2 weeks, have you felt little interest or pleasure in doing things?:no  Breast CA hx: maternal grandmother.  Gynecologic History Patient's last menstrual period was 01/26/2017. Contraception: IUD, placed 10/02/14 Last Pap: n/a. 1st pap today.  Last mammogram: n/a <40.   Obstetric History OB History  Gravida Para Term Preterm AB Living  0 0 0 0 0 0  SAB TAB Ectopic Multiple Live Births  0 0 0 0          Past Medical History:  Diagnosis Date  . Allergy     No past surgical history on file.   Current Outpatient Prescriptions:  .  Levonorgestrel (SKYLA) 13.5 MG IUD, by Intrauterine route. Placed 09/2014, Disp: , Rfl:  No Known Allergies  Social History  Substance Use Topics  . Smoking status: Current Some Day Smoker    Types: Cigars  . Smokeless tobacco: Never Used  . Alcohol use 0.0 oz/week     Comment: occasional    Family  History  Problem Relation Age of Onset  . Cancer Maternal Grandmother       Review of Systems  Constitutional: negative for fatigue and weight loss Respiratory: negative for cough and wheezing Cardiovascular: negative for chest pain, fatigue and palpitations Gastrointestinal: negative for abdominal pain and change in bowel habits Musculoskeletal:negative for myalgias Neurological: negative for gait problems and tremors Behavioral/Psych: negative for abusive relationship, depression Endocrine: negative for temperature intolerance    Genitourinary:negative for abnormal menstrual periods, genital lesions, hot flashes, sexual problems and vaginal discharge Integument/breast: negative for breast lump, breast tenderness, nipple discharge and skin lesion(s)    Objective:       BP 123/83   Pulse 61   Ht  (1.549 m)   Wt 159 lb 9.6 oz (72.4 kg)   LMP 01/26/2017   BMI 30.16 kg/m  General:   alert  Skin:   no rash or abnormalities  Lungs:   clear to auscultation bilaterally  Heart:   regular rate and rhythm, S1, S2 normal, no murmur, click, rub or gallop  Breasts:   normal without suspicious masses, skin or nipple changes or axillary nodes  Abdomen:  normal findings: no organomegaly, soft, non-tender and no hernia  Pelvis:  External genitalia: normal general appearance Urinary system: urethral meatus normal and bladder without fullness, nontender Vaginal: normal without tenderness, induration or masses Cervix: normal appearance, IUD strings present Adnexa: normal bimanual  exam Uterus: anteverted and non-tender, normal size   Lab Review Urine pregnancy test Labs reviewed yes Radiologic studies reviewed no  50% of 30 min visit spent on counseling and coordination of care.    Assessment & Plan    Healthy female exam.   1. Well woman exam     - Cytology - PAP  2. Family planning, IUD (intrauterine device) check/reinsertion/removal     IUD in place.   3. History of  chlamydia      - Cervicovaginal ancillary only  4. Screen for STD (sexually transmitted disease)     - Cervicovaginal ancillary only - HIV antibody (with reflex) - RPR    Education reviewed: calcium supplements, depression evaluation, low fat, low cholesterol diet, safe sex/STD prevention, self breast exams, skin cancer screening and weight bearing exercise. Contraception: IUD. Follow up in: 9 months.    Orders Placed This Encounter  Procedures  . HIV antibody (with reflex)  . RPR   Possible management options include: Kyleena IUD 10/02/17 Follow up as needed.

## 2017-02-02 NOTE — Progress Notes (Signed)
Patient is in the office for annual, patient currently has IUD in place.

## 2017-02-03 LAB — CERVICOVAGINAL ANCILLARY ONLY
CHLAMYDIA, DNA PROBE: NEGATIVE
NEISSERIA GONORRHEA: NEGATIVE
TRICH (WINDOWPATH): NEGATIVE

## 2017-02-03 LAB — HIV ANTIBODY (ROUTINE TESTING W REFLEX): HIV Screen 4th Generation wRfx: NONREACTIVE

## 2017-02-03 LAB — RPR: RPR: NONREACTIVE

## 2017-02-05 LAB — CYTOLOGY - PAP
DIAGNOSIS: NEGATIVE
HPV (WINDOPATH): DETECTED — AB
HPV 16/18/45 genotyping: NEGATIVE

## 2017-02-08 ENCOUNTER — Other Ambulatory Visit: Payer: Self-pay | Admitting: Certified Nurse Midwife

## 2017-02-08 DIAGNOSIS — R87820 Cervical low risk human papillomavirus (HPV) DNA test positive: Secondary | ICD-10-CM | POA: Insufficient documentation

## 2017-02-10 ENCOUNTER — Telehealth: Payer: Self-pay

## 2017-02-10 NOTE — Telephone Encounter (Signed)
Returned call and patient had questions about her lab results.

## 2017-05-13 ENCOUNTER — Other Ambulatory Visit: Payer: Self-pay

## 2017-05-13 ENCOUNTER — Ambulatory Visit (INDEPENDENT_AMBULATORY_CARE_PROVIDER_SITE_OTHER): Payer: Medicaid Other | Admitting: Nurse Practitioner

## 2017-05-13 ENCOUNTER — Encounter: Payer: Self-pay | Admitting: Nurse Practitioner

## 2017-05-13 VITALS — BP 129/76 | HR 79 | Ht 61.0 in | Wt 157.5 lb

## 2017-05-13 DIAGNOSIS — Z309 Encounter for contraceptive management, unspecified: Secondary | ICD-10-CM

## 2017-05-13 DIAGNOSIS — Z30431 Encounter for routine checking of intrauterine contraceptive device: Secondary | ICD-10-CM

## 2017-05-13 DIAGNOSIS — Z3200 Encounter for pregnancy test, result unknown: Secondary | ICD-10-CM

## 2017-05-13 DIAGNOSIS — Z3009 Encounter for other general counseling and advice on contraception: Secondary | ICD-10-CM

## 2017-05-13 NOTE — Progress Notes (Signed)
   GYNECOLOGY OFFICE VISIT NOTE   History:  22 y.o. G0P0000 here today for IUD removal. She denies any abnormal vaginal discharge, bleeding, pelvic pain or other concerns.  She has a [place] IUD which is not expired - still active until May 2019.  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  Additionally her grandmother had breast cancer beginning at age 22.    Past Medical History:  Diagnosis Date  . Allergy     No past surgical history on file.  The following portions of the patient's history were reviewed and updated as appropriate: allergies, current medications, past family history, past medical history, past social history, past surgical history and problem list.   Health Maintenance:  Normal pap and negative HRHPV 3 months ago.    Review of Systems:  Pertinent items noted in HPI and remainder of comprehensive ROS otherwise negative.  Objective:  Physical Exam BP 129/76   Pulse 79   Ht 5\' 1"  (1.549 m)   Wt 157 lb 8 oz (71.4 kg)   LMP 04/25/2017 (Exact Date)   BMI 29.76 kg/m  CONSTITUTIONAL: Well-developed, well-nourished female in no acute distress.  HENT:  Normocephalic, atraumatic.  EYES: Conjunctivae and EOM are normal. NECK: Normal range of motion, supple SKIN: Skin is warm and dry. No rash noted. Not diaphoretic. No erythema. No pallor. NEUROLOGIC: Alert and oriented to person, place, and time. Normal muscle tone and  coordination. No cranial nerve deficit noted. PSYCHIATRIC: Normal mood and affect. Normal behavior. Normal judgment and thought content. MUSCULOSKELETAL: Normal range of motion. No edema noted.  Labs and Imaging No results found.  Assessment & Plan:  1. General counseling and advice on contraceptive management Reproductive life plan discussed and client does not want to become pregnant.  Is very clear she does not want to be pregnant.  Used a tiered approach to counseling about her  contraceptive method and gave her written info.  BCP have a higher incidence of pregnancy than the IUD.  She has the most effective contraceptive method which will not worsen her low risk HPV or her chances of developing breast cancer.  After extensive discussion, client decided to keep her IUD for now and possibly have another IUD inserted when this one "expires".  [place].  Client was reassured by this discussion today and her contraceptive method (IUD) meets her reproductive life plan goal of not becoming pregnant right now.  2. Encounter for pregnancy test, result unknown Result was negative today - POCT urine pregnancy  3. IUD check up Currently has menses lasting up to 4 days each month with no cramping and light bleeding.   Strongly advised condoms with each intercourse to protect against STDs.  Client has had chlamydia previously and was treated.  Advised knowing sex partners well and having STD testing done with each partner prior to having intercourse.  Current partner she has had for one year.   Please refer to After Visit Summary for other counseling recommendations.   Return in about 6 months (around 11/10/2017).   Total face-to-face time with patient: 15 minutes.  Over 50% of encounter was spent on counseling and coordination of care.  Nolene BernheimERRI Adien Kimmel, RN, MSN, NP-BC Nurse Practitioner, [facility], [facility] 05/13/2017 3:24 PM

## 2017-05-25 ENCOUNTER — Encounter: Payer: Self-pay | Admitting: *Deleted

## 2017-07-06 ENCOUNTER — Encounter: Payer: Self-pay | Admitting: Certified Nurse Midwife

## 2017-07-06 ENCOUNTER — Ambulatory Visit (INDEPENDENT_AMBULATORY_CARE_PROVIDER_SITE_OTHER): Payer: Medicaid Other | Admitting: Certified Nurse Midwife

## 2017-07-06 ENCOUNTER — Ambulatory Visit: Payer: Medicaid Other | Admitting: Certified Nurse Midwife

## 2017-07-06 ENCOUNTER — Encounter: Payer: Self-pay | Admitting: *Deleted

## 2017-07-06 VITALS — BP 130/79 | HR 101 | Wt 160.6 lb

## 2017-07-06 DIAGNOSIS — Z30432 Encounter for removal of intrauterine contraceptive device: Secondary | ICD-10-CM

## 2017-07-06 DIAGNOSIS — Z3049 Encounter for surveillance of other contraceptives: Secondary | ICD-10-CM

## 2017-07-06 DIAGNOSIS — B9689 Other specified bacterial agents as the cause of diseases classified elsewhere: Secondary | ICD-10-CM

## 2017-07-06 DIAGNOSIS — N760 Acute vaginitis: Secondary | ICD-10-CM

## 2017-07-06 MED ORDER — FLUCONAZOLE 200 MG PO TABS: 200.0000 mg | ORAL_TABLET | Freq: Once | ORAL | 2 refills | Status: AC

## 2017-07-06 MED ORDER — METRONIDAZOLE 0.75 % VA GEL: 1.0000 | VAGINAL | 4 refills | Status: DC

## 2017-07-06 MED ORDER — TERCONAZOLE 0.8 % VA CREA: 1.0000 | TOPICAL_CREAM | Freq: Every day | VAGINAL | 2 refills | Status: DC

## 2017-07-06 NOTE — Progress Notes (Signed)
SKYLA REMOVAL   Reasons  for removal:  Is having chronic BV infections.  Is not having periods.     A timeout was performed confirming the patient, the procedure and allergy status. The patient was placed in the lithotomy position, a speculum was placed.  Cervix and strings visualized.   Long forceps used in a strile manner.  Stings grasped with long forceps and device removed intact.  The patient tolerated the procedure well.  New contraceptive method: no method, natural family planning (NFP).  OTC Prenatal vitamins suggested to the patient.  Vaginal discharge noted. Rx given for Metrogel for 4-6 months, Diflucan and Terazole cream.  Probiotics OTC recommended.  Will use condoms for now.  Swab cancelled due to family planning medicaid. Will treat off of symptoms and history.

## 2017-08-24 ENCOUNTER — Ambulatory Visit (INDEPENDENT_AMBULATORY_CARE_PROVIDER_SITE_OTHER): Payer: Medicaid Other | Admitting: Certified Nurse Midwife

## 2017-08-24 ENCOUNTER — Encounter: Payer: Self-pay | Admitting: Certified Nurse Midwife

## 2017-08-24 VITALS — BP 116/66 | HR 90 | Ht 61.0 in | Wt 155.8 lb

## 2017-08-24 DIAGNOSIS — Z30011 Encounter for initial prescription of contraceptive pills: Secondary | ICD-10-CM

## 2017-08-24 DIAGNOSIS — Z309 Encounter for contraceptive management, unspecified: Secondary | ICD-10-CM

## 2017-08-24 DIAGNOSIS — Z3009 Encounter for other general counseling and advice on contraception: Secondary | ICD-10-CM

## 2017-08-24 MED ORDER — NORETHIN-ETH ESTRAD-FE BIPHAS 1 MG-10 MCG / 10 MCG PO TABS: 1.0000 | ORAL_TABLET | Freq: Every day | ORAL | 4 refills | Status: DC

## 2017-08-24 NOTE — Progress Notes (Signed)
RGYN  patient presents for consult to discuss birth control. Pt considering the pills states she had good results in the past. Pt denies any unprotected intercourse.    LMP: 08/16/2017

## 2017-08-24 NOTE — Progress Notes (Signed)
Subjective:    Sabrina Atkins is a 22 y.o. female who presents for contraception counseling. The patient has no complaints today. The patient is sexually active. Pertinent past medical history: none.  Has been doing NFP methods and desires to try OCPs again.  Has used LoLoestrin in the past.    The information documented in the HPI was reviewed and verified.  Menstrual History: OB History    Gravida  0   Para  0   Term  0   Preterm  0   AB  0   Living  0     SAB  0   TAB  0   Ectopic  0   Multiple  0   Live Births               Patient's last menstrual period was 08/16/2017 (exact date).   Patient Active Problem List   Diagnosis Date Noted  . 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 IUD check up 04/30/2015  . General counseling and advice on female contraception 03/24/2013   Past Medical History:  Diagnosis Date  . Allergy     History reviewed. No pertinent surgical history.   Current Outpatient Medications:  .  metroNIDAZOLE (METROGEL VAGINAL) 0.75 % vaginal gel, Place 1 Applicatorful vaginally 2 (two) times a week. For 4-6 months. (Patient not taking: Reported on 08/24/2017), Disp: 70 g, Rfl: 4 .  Norethindrone-Ethinyl Estradiol-Fe Biphas (LO LOESTRIN FE) 1 MG-10 MCG / 10 MCG tablet, Take 1 tablet by mouth daily. Take 1 tablet by mouth daily., Disp: 3 Package, Rfl: 4 .  terconazole (TERAZOL 3) 0.8 % vaginal cream, Place 1 applicator vaginally at bedtime. (Patient not taking: Reported on 08/24/2017), Disp: 20 g, Rfl: 2 No Known Allergies  Social History   Tobacco Use  . Smoking status: Current Some Day Smoker    Types: Cigars  . Smokeless tobacco: Never Used  Substance Use Topics  . Alcohol use: Yes    Alcohol/week: 0.0 oz    Comment: occasional    Family History  Problem Relation Age of Onset  . Cancer Maternal Grandmother        Review of Systems Constitutional: negative for weight  loss Genitourinary:negative for abnormal menstrual periods and vaginal discharge, being treated for chronic BV with metrogel    Objective:   BP 116/66   Pulse 90   Ht 5\' 1"  (1.549 m)   Wt 155 lb 12.8 oz (70.7 kg)   LMP 08/16/2017 (Exact Date)   BMI 29.44 kg/m    General:   alert  PE deferred  Lab Review Urine pregnancy test Labs reviewed yes Radiologic studies reviewed no  100% of 15 min visit spent on counseling and coordination of care.    Assessment:    22 y.o., starting OCP (estrogen/progesterone), no contraindications.   Plan:    All questions answered. Meds ordered this encounter  Medications  . Norethindrone-Ethinyl Estradiol-Fe Biphas (LO LOESTRIN FE) 1 MG-10 MCG / 10 MCG tablet    Sig: Take 1 tablet by mouth daily. Take 1 tablet by mouth daily.    Dispense:  3 Package    Refill:  4    BIN # [id number], PCN# [id number], [id number], ID#: [id number]: [id number]   Orders Placed This Encounter  Procedures  . POCT urine pregnancy    Follow up as needed.

## 2018-07-29 ENCOUNTER — Other Ambulatory Visit (HOSPITAL_COMMUNITY)
Admission: RE | Admit: 2018-07-29 | Discharge: 2018-07-29 | Disposition: A | Discharge status: Home / Self Care | Payer: 59 | Source: Ambulatory Visit | Attending: Medical | Admitting: Medical

## 2018-07-29 ENCOUNTER — Encounter: Payer: Self-pay | Admitting: Medical

## 2018-07-29 ENCOUNTER — Other Ambulatory Visit: Payer: Self-pay

## 2018-07-29 ENCOUNTER — Ambulatory Visit (INDEPENDENT_AMBULATORY_CARE_PROVIDER_SITE_OTHER): Payer: 59 | Admitting: Medical

## 2018-07-29 VITALS — BP 130/79 | HR 66 | Ht 61.0 in | Wt 152.0 lb

## 2018-07-29 DIAGNOSIS — Z01419 Encounter for gynecological examination (general) (routine) without abnormal findings: Secondary | ICD-10-CM | POA: Diagnosis not present

## 2018-07-29 DIAGNOSIS — Z113 Encounter for screening for infections with a predominantly sexual mode of transmission: Secondary | ICD-10-CM | POA: Diagnosis present

## 2018-07-29 DIAGNOSIS — N760 Acute vaginitis: Secondary | ICD-10-CM

## 2018-07-29 DIAGNOSIS — B9689 Other specified bacterial agents as the cause of diseases classified elsewhere: Secondary | ICD-10-CM

## 2018-07-29 NOTE — Patient Instructions (Signed)

## 2018-07-29 NOTE — Progress Notes (Signed)
Subjective:    Sabrina Atkins is a 23 y.o. female who presents for an annual exam. The patient has no complaints today. The patient is not currently sexually active. GYN screening history: last pap: approximate date 08/2016 and was normal. The patient reports that there is not domestic violence in her life. Patient states regular periods, lasting 7 days. Moderate bleeding with mild cramping. Denies discharge, pelvic pain, fever, UTI symptoms today. Patient is no longer taking OCPs previously prescribed and would prefer to remain off of birth control at this time.   Menstrual History: OB History    Gravida  0   Para  0   Term  0   Preterm  0   AB  0   Living  0     SAB  0   TAB  0   Ectopic  0   Multiple  0   Live Births               Patient's last menstrual period was 07/25/2018.    The following portions of the patient's history were reviewed and updated as appropriate: allergies, current medications, past family history, past medical history, past social history, past surgical history and problem list.  Review of Systems Pertinent items are noted in HPI.    Objective:   BP 130/79   Pulse 66   Ht 5\' 1"  (1.549 m)   Wt 152 lb (68.9 kg)   LMP 07/25/2018   BMI 28.72 kg/m  Physical Exam  Vitals reviewed. Constitutional: She is oriented to person, place, and time. She appears well-developed and well-nourished. No distress.  HENT:  Head: Normocephalic and atraumatic.  Eyes: EOM are normal.  Neck: Normal range of motion. Neck supple. No thyromegaly present.  Cardiovascular: Normal rate, regular rhythm and normal heart sounds.  No murmur heard. Respiratory: Effort normal and breath sounds normal. No respiratory distress. She has no wheezes.  GI: Bowel sounds are normal. She exhibits no distension and no mass. There is no abdominal tenderness. There is no rebound and no guarding.  Genitourinary: There is no rash, tenderness or lesion on the right labia. There is no  rash, tenderness or lesion on the left labia. Uterus is not enlarged and not tender. Cervix exhibits no motion tenderness, no discharge and no friability. Right adnexum displays no mass, no tenderness and no fullness. Left adnexum displays no mass, no tenderness and no fullness.    Vaginal bleeding (scant) present.     No vaginal discharge.  There is bleeding (scant) in the vagina.  Musculoskeletal:        General: No edema.  Neurological: She is alert and oriented to person, place, and time.  Skin: Skin is warm. No erythema.  Psychiatric: She has a normal mood and affect.      Assessment:    Healthy female exam.   STD screening    Plan:     Aptima swab collected   HIV, RPR, Hep B and Hep C drawn  Patient will be contacted with any abnormal results Patient will follow-up with [facility] 08/2019 for next annual exam with pap smear or sooner PRN    Marny Lowenstein, PA-C 07/29/2018 8:31 AM

## 2018-07-30 LAB — HEPATITIS B SURFACE ANTIGEN: HEP B S AG: NEGATIVE

## 2018-07-30 LAB — HEPATITIS C ANTIBODY

## 2018-07-30 LAB — RPR: RPR: NONREACTIVE

## 2018-07-30 LAB — HIV ANTIBODY (ROUTINE TESTING W REFLEX): HIV Screen 4th Generation wRfx: NONREACTIVE

## 2018-08-02 LAB — CERVICOVAGINAL ANCILLARY ONLY
BACTERIAL VAGINITIS: POSITIVE — AB
CANDIDA VAGINITIS: NEGATIVE
CHLAMYDIA, DNA PROBE: NEGATIVE
NEISSERIA GONORRHEA: NEGATIVE
TRICH (WINDOWPATH): NEGATIVE

## 2018-08-03 MED ORDER — METRONIDAZOLE 500 MG PO TABS: 500.0000 mg | ORAL_TABLET | Freq: Two times a day (BID) | ORAL | 0 refills | Status: DC

## 2018-08-03 NOTE — Addendum Note (Signed)
Addended by: Marny Lowenstein on: 08/03/2018 09:35 AM   Modules accepted: Orders

## 2018-12-16 ENCOUNTER — Other Ambulatory Visit: Payer: Self-pay

## 2018-12-16 ENCOUNTER — Emergency Department (HOSPITAL_COMMUNITY)
Admission: EM | Admit: 2018-12-16 | Discharge: 2018-12-17 | Disposition: A | Discharge status: Home / Self Care | Payer: 59 | Attending: Emergency Medicine | Admitting: Emergency Medicine

## 2018-12-16 ENCOUNTER — Encounter (HOSPITAL_COMMUNITY): Payer: Self-pay

## 2018-12-16 DIAGNOSIS — F17290 Nicotine dependence, other tobacco product, uncomplicated: Secondary | ICD-10-CM | POA: Insufficient documentation

## 2018-12-16 DIAGNOSIS — R079 Chest pain, unspecified: Secondary | ICD-10-CM | POA: Insufficient documentation

## 2018-12-16 LAB — COMPREHENSIVE METABOLIC PANEL
ALT: 11 U/L (ref 0–44)
AST: 17 U/L (ref 15–41)
Albumin: 3.8 g/dL (ref 3.5–5.0)
Alkaline Phosphatase: 34 U/L — ABNORMAL LOW (ref 38–126)
Anion gap: 10 (ref 5–15)
BUN: 10 mg/dL (ref 6–20)
CO2: 23 mmol/L (ref 22–32)
Calcium: 9.3 mg/dL (ref 8.9–10.3)
Chloride: 106 mmol/L (ref 98–111)
Creatinine, Ser: 1.03 mg/dL — ABNORMAL HIGH (ref 0.44–1.00)
GFR calc Af Amer: >60 mL/min (ref >60)
GFR calc non Af Amer: >60 mL/min (ref >60)
Glucose, Bld: 102 mg/dL — ABNORMAL HIGH (ref 70–99)
Potassium: 3.9 mmol/L (ref 3.5–5.1)
Sodium: 139 mmol/L (ref 135–145)
Total Bilirubin: 0.3 mg/dL (ref 0.3–1.2)
Total Protein: 7.8 g/dL (ref 6.5–8.1)

## 2018-12-16 LAB — CBC
HCT: 35.6 % — ABNORMAL LOW (ref 36.0–46.0)
Hemoglobin: 11.7 g/dL — ABNORMAL LOW (ref 12.0–15.0)
MCH: 31.8 pg (ref 26.0–34.0)
MCHC: 32.9 g/dL (ref 30.0–36.0)
MCV: 96.7 fL (ref 80.0–100.0)
Platelets: 389 10*3/uL (ref 150–400)
RBC: 3.68 MIL/uL — ABNORMAL LOW (ref 3.87–5.11)
RDW: 13.2 % (ref 11.5–15.5)
WBC: 10.4 10*3/uL (ref 4.0–10.5)
nRBC: 0 % (ref 0.0–0.2)

## 2018-12-16 LAB — URINALYSIS, ROUTINE W REFLEX MICROSCOPIC
Bilirubin Urine: NEGATIVE
Glucose, UA: NEGATIVE mg/dL
Ketones, ur: NEGATIVE mg/dL
Nitrite: NEGATIVE
Protein, ur: NEGATIVE mg/dL
Specific Gravity, Urine: 1.016 (ref 1.005–1.030)
pH: 5 (ref 5.0–8.0)

## 2018-12-16 LAB — I-STAT BETA HCG BLOOD, ED (MC, WL, AP ONLY): I-stat hCG, quantitative: >2000 m[IU]/mL — ABNORMAL HIGH (ref <5)

## 2018-12-16 LAB — LIPASE, BLOOD: Lipase: 29 U/L (ref 11–51)

## 2018-12-16 MED ORDER — LIDOCAINE VISCOUS HCL 2 % MT SOLN
15.0000 mL | Freq: Once | OROMUCOSAL | Status: AC
  Administered 2018-12-17: 01:00:00 15 mL via OROMUCOSAL
  Filled 2018-12-16: qty 15

## 2018-12-16 MED ORDER — ALUM & MAG HYDROXIDE-SIMETH 200-200-20 MG/5ML PO SUSP
15.0000 mL | Freq: Once | ORAL | Status: AC
  Administered 2018-12-17: 01:00:00 15 mL via ORAL
  Filled 2018-12-16: qty 30

## 2018-12-16 NOTE — ED Triage Notes (Signed)
Pt reports abdominal discomfort since an abortion last week. She is concerned about toxic shock syndrome. She also reports epigastric pain that is alleviated when drinking water that started earlier today.

## 2018-12-16 NOTE — ED Notes (Signed)
Urine culture sent to the lab. 

## 2018-12-16 NOTE — ED Provider Notes (Signed)
[facility] DEPT Provider Note   CSN: [id number] Arrival date & time: 12/16/18  2035    History   Chief Complaint Chief Complaint  Patient presents with  . Chest Pain    HPI Sabrina Atkins is a 23 y.o. female G1P0, presenting to the ED with complaint of central chest pain that began today. Pain is described as a achy/dull pain, intermittent and only lasting about 5 minutes in duration.  No aggravating or alleviating factors.  Symptoms are not associated with meals though she states it does feel better somewhat with water.  Denies fever, cough, urinary sx. she does note she recently had a chemical abortion in [place] at 6 weeks 5 days gestation on 7/28 and 7/29/200, reports some vaginal bleeding still, however was told this is expected and is not having any concerns or complaints regarding this at this time. No cardiac history.  No tobacco history.  No history of PE or DVT.  No swelling in her legs.     The history is provided by the patient.    Past Medical History:  Diagnosis Date  . Allergy     Patient Active Problem List   Diagnosis Date Noted  . 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 No pertinent surgical history.   OB History    Gravida  0   Para  0   Term  0   Preterm  0   AB  0   Living  0     SAB  0   TAB  0   Ectopic  0   Multiple  0   Live Births               Home Medications    Prior to Admission medications   Medication Sig Start Date End Date Taking? Authorizing Provider  metroNIDAZOLE (FLAGYL) 500 MG tablet Take 1 tablet (500 mg total) by mouth 2 (two) times daily. 08/03/18   Luvenia Redden, PA-C  Norethindrone-Ethinyl Estradiol-Fe Biphas (LO LOESTRIN FE) 1 MG-10 MCG / 10 MCG tablet Take 1 tablet by mouth daily. Take 1 tablet by mouth daily. Patient not taking: Reported on 07/29/2018 08/24/17   Kandis Cocking A, CNM  terconazole (TERAZOL 3) 0.8 %  vaginal cream Place 1 applicator vaginally at bedtime. Patient not taking: Reported on 08/24/2017 07/06/17   Morene Crocker, CNM    Family History Family History  Problem Relation Age of Onset  . Cancer Maternal Grandmother     Social History Social History   Tobacco Use  . Smoking status: Current Some Day Smoker    Types: Cigars  . Smokeless tobacco: Never Used  Substance Use Topics  . Alcohol use: Yes    Alcohol/week: 0.0 standard drinks    Comment: occasional  . Drug use: No     Allergies   Patient has no known allergies.   Review of Systems Review of Systems  All other systems reviewed and are negative.    Physical Exam Updated Vital Signs BP 122/84   Pulse 63   Temp 98.1 F (36.7 C) (Oral)   Resp 14   Ht 5\' 1"  (1.549 m)   Wt 68.9 kg   SpO2 100%   BMI 28.72 kg/m   Physical Exam Vitals signs and nursing note reviewed.  Constitutional:      General: She is not in acute distress.    Appearance: She is well-developed.  HENT:  Head: Normocephalic and atraumatic.  Eyes:     Conjunctiva/sclera: Conjunctivae normal.  Cardiovascular:     Rate and Rhythm: Normal rate and regular rhythm.     Pulses: Normal pulses.     Heart sounds: Normal heart sounds.  Pulmonary:     Effort: Pulmonary effort is normal. No respiratory distress.     Breath sounds: Normal breath sounds.  Chest:     Chest wall: No tenderness.  Abdominal:     General: Bowel sounds are normal.     Palpations: Abdomen is soft.     Tenderness: There is no abdominal tenderness. There is no guarding or rebound.  Musculoskeletal:     Right lower leg: No edema.     Left lower leg: No edema.  Skin:    General: Skin is warm.  Neurological:     Mental Status: She is alert.  Psychiatric:        Behavior: Behavior normal.      ED Treatments / Results  Labs (all labs ordered are listed, but only abnormal results are displayed) Labs Reviewed  COMPREHENSIVE METABOLIC PANEL - Abnormal;  Notable for the following components:      Result Value   Glucose, Bld 102 (*)    Creatinine, Ser 1.03 (*)    Alkaline Phosphatase 34 (*)    All other components within normal limits  CBC - Abnormal; Notable for the following components:   RBC 3.68 (*)    Hemoglobin 11.7 (*)    HCT 35.6 (*)    All other components within normal limits  URINALYSIS, ROUTINE W REFLEX MICROSCOPIC - Abnormal; Notable for the following components:   APPearance HAZY (*)    Hgb urine dipstick LARGE (*)    Leukocytes,Ua LARGE (*)    Bacteria, UA MANY (*)    All other components within normal limits  I-STAT BETA HCG BLOOD, ED ([facility], [facility], [facility] ONLY) - Abnormal; Notable for the following components:   I-stat hCG, quantitative >2,000.0 (*)    All other components within normal limits  URINE CULTURE  LIPASE, BLOOD    EKG EKG Interpretation  Date/Time:  Saturday December 17 2018 00:21:34 EDT Ventricular Rate:  83 PR Interval:    QRS Duration: 80 QT Interval:  360 QTC Calculation: 423 R Axis:   41 Text Interpretation:  Sinus rhythm No significant change since last tracing Confirmed by Drema Pryardama, Pedro [phone]([id number]) on 12/17/2018 12:37:39 AM   Radiology Dg Chest 2 View  Result Date: 12/17/2018 CLINICAL DATA:  Recent abortion with abdominal discomfort and epigastric pain EXAM: CHEST - 2 VIEW COMPARISON:  12/17/2014 FINDINGS: The heart size and mediastinal contours are within normal limits. Both lungs are clear. The visualized skeletal structures are unremarkable. IMPRESSION: No active cardiopulmonary disease. Electronically Signed   By: Alcide CleverMark  Lukens M.D.   On: 12/17/2018 00:57    Procedures Procedures (including critical care time)  Medications Ordered in ED Medications  alum & mag hydroxide-simeth (MAALOX/MYLANTA) 200-200-20 MG/5ML suspension 15 mL (15 mLs Oral Given 12/17/18 0040)  lidocaine (XYLOCAINE) 2 % viscous mouth solution 15 mL (15 mLs Mouth/Throat Given 12/17/18 0040)     Initial Impression / Assessment and  Plan / ED Course  I have reviewed the triage vital signs and the nursing notes.  Pertinent labs & imaging results that were available during my care of the patient were reviewed by me and considered in my medical decision making (see chart for details).        Patient presenting with  intermittent aching central chest pain that has been coming and going throughout the day.  No associated symptoms.  No cardiac history or history of DVT/PE.  She has no shortness of breath or infectious symptoms.  Her vital signs are very reassuring, no tachycardia, tachypnea.  Her O2 saturation is 100% on room air.  Lungs are clear.  Heart sounds are normal.  Labs are reassuring.  UA appears to be contaminated, will send culture.  She is not having any urinary symptoms.  EKG without arrhythmia or ischemia.  Chest x-ray is neg.Patient discussed with Dr. Eudelia Bunchardama at this time, low suspicion for PE or cardiac etiology of patient's symptoms.  Symptoms are coming and going, vital signs are normal.  She is treated with GI cocktail recommended to take trial of Pepcid at home for symptoms.  She has close follow-up for trending hCG status post planned abortion.  Strict return precautions.    Discussed results, findings, treatment and follow up. Patient advised of return precautions. Patient verbalized understanding and agreed with plan.  Final Clinical Impressions(s) / ED Diagnoses   Final diagnoses:  Intermittent chest pain    ED Discharge Orders    None       Zoelle Markus, [place] N, PA-C 12/17/18 0106    Nira Connardama, Pedro Eduardo, MD 12/17/18 [phone]

## 2018-12-17 ENCOUNTER — Emergency Department (HOSPITAL_COMMUNITY): Payer: 59

## 2018-12-17 NOTE — Discharge Instructions (Signed)
Your labs, chest x-ray, and EKG are very reassuring today. You can try taking a Pepcid every 12 hours to see if it helps her symptoms. Follow-up with your primary care provider symptoms persist. If symptoms get worse, if you develop shortness of breath, coughing up blood, or new or concerning symptoms, please report to the emergency department for reevaluation.

## 2019-01-06 ENCOUNTER — Ambulatory Visit (INDEPENDENT_AMBULATORY_CARE_PROVIDER_SITE_OTHER): Payer: 59

## 2019-01-06 ENCOUNTER — Other Ambulatory Visit: Payer: Self-pay

## 2019-01-06 VITALS — BP 122/81 | HR 80 | Wt 153.0 lb

## 2019-01-06 DIAGNOSIS — Z3042 Encounter for surveillance of injectable contraceptive: Secondary | ICD-10-CM

## 2019-01-06 DIAGNOSIS — Z30013 Encounter for initial prescription of injectable contraceptive: Secondary | ICD-10-CM | POA: Diagnosis not present

## 2019-01-06 DIAGNOSIS — Z3202 Encounter for pregnancy test, result negative: Secondary | ICD-10-CM | POA: Diagnosis not present

## 2019-01-06 DIAGNOSIS — Z9889 Other specified postprocedural states: Secondary | ICD-10-CM | POA: Insufficient documentation

## 2019-01-06 LAB — POCT URINE PREGNANCY: Preg Test, Ur: NEGATIVE

## 2019-01-06 MED ORDER — MEDROXYPROGESTERONE ACETATE 150 MG/ML IM SUSP: 150.0000 mg | INTRAMUSCULAR | 1 refills | Status: DC

## 2019-01-06 MED ORDER — MEDROXYPROGESTERONE ACETATE 150 MG/ML IM SUSP
150.0000 mg | Freq: Once | INTRAMUSCULAR | Status: AC
  Administered 2019-01-06: 09:00:00 150 mg via INTRAMUSCULAR

## 2019-01-06 NOTE — Patient Instructions (Signed)
Healthy Eating Following a healthy eating pattern may help you to achieve and maintain a healthy body weight, reduce the risk of chronic disease, and live a long and productive life. It is important to follow a healthy eating pattern at an appropriate calorie level for your body. Your nutritional needs should be met primarily through food by choosing a variety of nutrient-rich foods. What are tips for following this plan? Reading food labels  Read labels and choose the following: ? Reduced or low sodium. ? Juices with 100% fruit juice. ? Foods with low saturated fats and high polyunsaturated and monounsaturated fats. ? Foods with whole grains, such as whole wheat, cracked wheat, brown rice, and wild rice. ? Whole grains that are fortified with folic acid. This is recommended for women who are pregnant or who want to become pregnant.  Read labels and avoid the following: ? Foods with a lot of added sugars. These include foods that contain brown sugar, corn sweetener, corn syrup, dextrose, fructose, glucose, high-fructose corn syrup, honey, invert sugar, lactose, malt syrup, maltose, molasses, raw sugar, sucrose, trehalose, or turbinado sugar.  Do not eat more than the following amounts of added sugar per day:  6 teaspoons (25 g) for women.  9 teaspoons (38 g) for men. ? Foods that contain processed or refined starches and grains. ? Refined grain products, such as Pretty flour, degermed cornmeal, [place] bread, and [place] rice. Shopping  Choose nutrient-rich snacks, such as vegetables, whole fruits, and nuts. Avoid high-calorie and high-sugar snacks, such as potato chips, fruit snacks, and candy.  Use oil-based dressings and spreads on foods instead of solid fats such as butter, stick margarine, or cream cheese.  Limit pre-made sauces, mixes, and "instant" products such as flavored rice, instant noodles, and ready-made pasta.  Try more plant-protein sources, such as tofu, tempeh, black beans,  edamame, lentils, nuts, and seeds.  Explore eating plans such as the Mediterranean diet or vegetarian diet. Cooking  Use oil to saut or stir-fry foods instead of solid fats such as butter, stick margarine, or lard.  Try baking, boiling, grilling, or broiling instead of frying.  Remove the fatty part of meats before cooking.  Steam vegetables in water or broth. Meal planning   At meals, imagine dividing your plate into fourths: ? One-half of your plate is fruits and vegetables. ? One-fourth of your plate is whole grains. ? One-fourth of your plate is protein, especially lean meats, poultry, eggs, tofu, beans, or nuts.  Include low-fat dairy as part of your daily diet. Lifestyle  Choose healthy options in all settings, including home, work, school, restaurants, or stores.  Prepare your food safely: ? Wash your hands after handling raw meats. ? Keep food preparation surfaces clean by regularly washing with hot, soapy water. ? Keep raw meats separate from ready-to-eat foods, such as fruits and vegetables. ? Cook seafood, meat, poultry, and eggs to the recommended internal temperature. ? Store foods at safe temperatures. In general:  Keep cold foods at 59F (4.4C) or below.  Keep hot foods at 159F (60C) or above.  Keep your freezer at [facility] (-17.8C) or below.  Foods are no longer safe to eat when they have been between the temperatures of 40-159F (4.4-60C) for more than 2 hours. What foods should I eat? Fruits Aim to eat 2 cup-equivalents of fresh, canned (in natural juice), or frozen fruits each day. Examples of 1 cup-equivalent of fruit include 1 small apple, 8 large strawberries, 1 cup canned fruit,  cup  dried fruit, or 1 cup 100% juice. Vegetables Aim to eat 2-3 cup-equivalents of fresh and frozen vegetables each day, including different varieties and colors. Examples of 1 cup-equivalent of vegetables include 2 medium carrots, 2 cups raw, leafy greens, 1 cup chopped  vegetable (raw or cooked), or 1 medium baked potato. Grains Aim to eat 6 ounce-equivalents of whole grains each day. Examples of 1 ounce-equivalent of grains include 1 slice of bread, 1 cup ready-to-eat cereal, 3 cups popcorn, or  cup cooked rice, pasta, or cereal. Meats and other proteins Aim to eat 5-6 ounce-equivalents of protein each day. Examples of 1 ounce-equivalent of protein include 1 egg, 1/2 cup nuts or seeds, or 1 tablespoon (16 g) peanut butter. A cut of meat or fish that is the size of a deck of cards is about 3-4 ounce-equivalents.  Of the protein you eat each week, try to have at least 8 ounces come from seafood. This includes salmon, trout, herring, and anchovies. Dairy Aim to eat 3 cup-equivalents of fat-free or low-fat dairy each day. Examples of 1 cup-equivalent of dairy include 1 cup (240 mL) milk, 8 ounces (250 g) yogurt, 1 ounces (44 g) natural cheese, or 1 cup (240 mL) fortified soy milk. Fats and oils  Aim for about 5 teaspoons (21 g) per day. Choose monounsaturated fats, such as canola and olive oils, avocados, peanut butter, and most nuts, or polyunsaturated fats, such as sunflower, corn, and soybean oils, walnuts, pine nuts, sesame seeds, sunflower seeds, and flaxseed. Beverages  Aim for six 8-oz glasses of water per day. Limit coffee to three to five 8-oz cups per day.  Limit caffeinated beverages that have added calories, such as soda and energy drinks.  Limit alcohol intake to no more than 1 drink a day for nonpregnant women and 2 drinks a day for men. One drink equals 12 oz of beer (355 mL), 5 oz of wine (148 mL), or 1 oz of hard liquor (44 mL). Seasoning and other foods  Avoid adding excess amounts of salt to your foods. Try flavoring foods with herbs and spices instead of salt.  Avoid adding sugar to foods.  Try using oil-based dressings, sauces, and spreads instead of solid fats. This information is based on general [place] nutrition guidelines. For more  information, visit Build[employer].es. Exact amounts may vary based on your nutrition needs. Summary  A healthy eating plan may help you to maintain a healthy weight, reduce the risk of chronic diseases, and stay active throughout your life.  Plan your meals. Make sure you eat the right portions of a variety of nutrient-rich foods.  Try baking, boiling, grilling, or broiling instead of frying.  Choose healthy options in all settings, including home, work, school, restaurants, or stores. This information is not intended to replace advice given to you by your health care provider. Make sure you discuss any questions you have with your health care provider. Document Released: 08/09/2017 Document Revised: 08/09/2017 Document Reviewed: 08/09/2017 Elsevier Patient Education  [place]. Medroxyprogesterone injection [Contraceptive] What is this medicine? MEDROXYPROGESTERONE (me DROX ee proe JES te rone) contraceptive injections prevent pregnancy. They provide effective birth control for 3 months. Depo-subQ Provera 104 is also used for treating pain related to endometriosis. This medicine may be used for other purposes; ask your health care provider or pharmacist if you have questions. COMMON BRAND NAME(S): Depo-Provera, Depo-subQ Provera 104 What should I tell my health care provider before I take this medicine? They need to know if  you have any of these conditions:  frequently drink alcohol  asthma  blood vessel disease or a history of a blood clot in the lungs or legs  bone disease such as osteoporosis  breast cancer  diabetes  eating disorder (anorexia nervosa or bulimia)  high blood pressure  HIV infection or AIDS  kidney disease  liver disease  mental depression  migraine  seizures (convulsions)  stroke  tobacco smoker  vaginal bleeding  an unusual or allergic reaction to medroxyprogesterone, other hormones, medicines, foods, dyes, or preservatives   pregnant or trying to get pregnant  breast-feeding How should I use this medicine? Depo-Provera Contraceptive injection is given into a muscle. Depo-subQ Provera 104 injection is given under the skin. These injections are given by a health care professional. You must not be pregnant before getting an injection. The injection is usually given during the first 5 days after the start of a menstrual period or 6 weeks after delivery of a baby. Talk to your pediatrician regarding the use of this medicine in children. Special care may be needed. These injections have been used in female children who have started having menstrual periods. Overdosage: If you think you have taken too much of this medicine contact a poison control center or emergency room at once. NOTE: This medicine is only for you. Do not share this medicine with others. What if I miss a dose? Try not to miss a dose. You must get an injection once every 3 months to maintain birth control. If you cannot keep an appointment, call and reschedule it. If you wait longer than 13 weeks between Depo-Provera contraceptive injections or longer than 14 weeks between Depo-subQ Provera 104 injections, you could get pregnant. Use another method for birth control if you miss your appointment. You may also need a pregnancy test before receiving another injection. What may interact with this medicine? Do not take this medicine with any of the following medications:  bosentan This medicine may also interact with the following medications:  aminoglutethimide  antibiotics or medicines for infections, especially rifampin, rifabutin, rifapentine, and griseofulvin  aprepitant  barbiturate medicines such as phenobarbital or primidone  bexarotene  carbamazepine  medicines for seizures like ethotoin, felbamate, oxcarbazepine, phenytoin, topiramate  modafinil  St. John's wort This list may not describe all possible interactions. Give your health care  provider a list of all the medicines, herbs, non-prescription drugs, or dietary supplements you use. Also tell them if you smoke, drink alcohol, or use illegal drugs. Some items may interact with your medicine. What should I watch for while using this medicine? This drug does not protect you against HIV infection (AIDS) or other sexually transmitted diseases. Use of this product may cause you to lose calcium from your bones. Loss of calcium may cause weak bones (osteoporosis). Only use this product for more than 2 years if other forms of birth control are not right for you. The longer you use this product for birth control the more likely you will be at risk for weak bones. Ask your health care professional how you can keep strong bones. You may have a change in bleeding pattern or irregular periods. Many females stop having periods while taking this drug. If you have received your injections on time, your chance of being pregnant is very low. If you think you may be pregnant, see your health care professional as soon as possible. Tell your health care professional if you want to get pregnant within the next year. The  effect of this medicine may last a long time after you get your last injection. What side effects may I notice from receiving this medicine? Side effects that you should report to your doctor or health care professional as soon as possible:  allergic reactions like skin rash, itching or hives, swelling of the face, lips, or tongue  breast tenderness or discharge  breathing problems  changes in vision  depression  feeling faint or lightheaded, falls  fever  pain in the abdomen, chest, groin, or leg  problems with balance, talking, walking  unusually weak or tired  yellowing of the eyes or skin Side effects that usually do not require medical attention (report to your doctor or health care professional if they continue or are bothersome):  acne  fluid retention and  swelling  headache  irregular periods, spotting, or absent periods  temporary pain, itching, or skin reaction at site where injected  weight gain This list may not describe all possible side effects. Call your doctor for medical advice about side effects. You may report side effects to FDA at 1-800-FDA-1088. Where should I keep my medicine? This does not apply. The injection will be given to you by a health care professional. NOTE: This sheet is a summary. It may not cover all possible information. If you have questions about this medicine, talk to your doctor, pharmacist, or health care provider.  2020 Elsevier/Gold Standard (2008-05-18 18:37:56)

## 2019-01-06 NOTE — Progress Notes (Signed)
Pt is here today to discuss contraception. Pt has previously tried pill, nexplanon, and skyla IUD. Pt is not interested in depo. Pt recently had abortion 12/06/18.

## 2019-01-06 NOTE — Progress Notes (Signed)
GYNECOLOGY OFFICE VISIT NOTE  History:   Sabrina Atkins [id number] here today for birth control consult. She reports a history of Nexplanon, IUD (Skyla), and pill. She expresses an interest in Depo Provera injections today with the hope that "this works with me."  Patient states other birth control methods have not worked for various reasons citing bleeding, bacterial infections, and forgetfulness as causes for changes in therapy.  Patient reports that her last menses was in June and that she underwent a therapeutic abortion in July via Cytotec.  She denies any abnormal vaginal discharge, pelvic pain or other concerns. She does report some continued spotting since her abortion.   Past Medical History:  Diagnosis Date  . Allergy     History reviewed. No pertinent surgical history.  The following portions of the patient's history were reviewed and updated as appropriate: allergies, current medications, past family history, past medical history, past social history, past surgical history and problem list.   Health Maintenance:  Normal pap and positive HRHPV on Oct 2018.  No mammogram due to age.   Review of Systems:  Pertinent items noted in HPI and remainder of comprehensive ROS otherwise negative.    Objective:    Physical Exam BP 122/81   Pulse 80   Wt 153 lb (69.4 kg)   BMI 28.91 kg/m  Physical Exam Constitutional:      Appearance: Normal appearance.  HENT:     Head: Normocephalic and atraumatic.  Eyes:     Conjunctiva/sclera: Conjunctivae normal.  Neck:     Musculoskeletal: Normal range of motion.  Cardiovascular:     Rate and Rhythm: Normal rate.  Pulmonary:     Effort: Pulmonary effort is normal.  Musculoskeletal: Normal range of motion.  Skin:    General: Skin is warm and dry.  Neurological:     Mental Status: She is alert and oriented to person, place, and time.  Psychiatric:        Mood and Affect: Mood normal.        Thought Content: Thought content normal.       Labs and Imaging No results found for this or any previous visit (from the past 168 hour(s)). Dg Chest 2 View  Result Date: 12/17/2018 CLINICAL DATA:  Recent abortion with abdominal discomfort and epigastric pain EXAM: CHEST - 2 VIEW COMPARISON:  12/17/2014 FINDINGS: The heart size and mediastinal contours are within normal limits. Both lungs are clear. The visualized skeletal structures are unremarkable. IMPRESSION: No active cardiopulmonary disease. Electronically Signed   By: Inez Catalina M.D.   On: 12/17/2018 00:57     Assessment & Plan:       1. Encounter for initial prescription of injectable contraceptive -Extensive review of depo provera including benefits and side effects. -Discussed possibility of changes in bleeding pattern to include excessive, spotty, or cessation of bleeding. *Encouraged to allow for completion of at least 3 injections, unless excessive bleeding resulting in anemia occurs, before deciding on a change in therapy.  -Extensive discussion regarding rumors of increased weight gain s/t depo provera injections. *Encouraged mindfulness of nutritional intake and initiation/continuation of exercise regime. -Patient without further questions or concerns and desires depo provera today. -UPT Negative. -Rx for Depo Provera sent to pharmacy on file.  -RTO in 12 weeks for 2nd injection.    Routine preventative health maintenance measures emphasized. Please refer to After Visit Summary for other counseling recommendations.   Return in about 12 weeks (around 03/31/2019), or Depo Provera Injection-Nurse  Visit.      Cherre RobinsJessica L Zayvier Caravello, CNM 01/06/2019

## 2019-03-17 ENCOUNTER — Other Ambulatory Visit: Payer: Self-pay

## 2019-03-17 DIAGNOSIS — Z20822 Contact with and (suspected) exposure to covid-19: Secondary | ICD-10-CM

## 2019-03-17 NOTE — Addendum Note (Signed)
Addended by: Kathlene November on: 03/17/2019 08:21 AM   Modules accepted: Orders

## 2019-03-18 LAB — NOVEL CORONAVIRUS, NAA: SARS-CoV-2, NAA: NOT DETECTED

## 2019-03-30 ENCOUNTER — Ambulatory Visit: Payer: Medicaid Other

## 2019-09-12 ENCOUNTER — Ambulatory Visit: Payer: Medicaid Other | Admitting: Family Medicine

## 2019-09-21 ENCOUNTER — Other Ambulatory Visit (HOSPITAL_COMMUNITY)
Admission: RE | Admit: 2019-09-21 | Discharge: 2019-09-21 | Disposition: A | Discharge status: Home / Self Care | Payer: Medicaid Other | Source: Ambulatory Visit | Attending: Family Medicine | Admitting: Family Medicine

## 2019-09-21 ENCOUNTER — Encounter: Payer: Self-pay | Admitting: Obstetrics

## 2019-09-21 ENCOUNTER — Other Ambulatory Visit: Payer: Self-pay

## 2019-09-21 ENCOUNTER — Ambulatory Visit (INDEPENDENT_AMBULATORY_CARE_PROVIDER_SITE_OTHER): Payer: Medicaid Other | Admitting: Obstetrics

## 2019-09-21 VITALS — BP 129/87 | HR 77 | Ht 61.0 in | Wt 160.0 lb

## 2019-09-21 DIAGNOSIS — Z124 Encounter for screening for malignant neoplasm of cervix: Secondary | ICD-10-CM | POA: Insufficient documentation

## 2019-09-21 DIAGNOSIS — Z113 Encounter for screening for infections with a predominantly sexual mode of transmission: Secondary | ICD-10-CM | POA: Insufficient documentation

## 2019-09-21 DIAGNOSIS — Z01419 Encounter for gynecological examination (general) (routine) without abnormal findings: Secondary | ICD-10-CM

## 2019-09-21 DIAGNOSIS — Z3009 Encounter for other general counseling and advice on contraception: Secondary | ICD-10-CM

## 2019-09-21 DIAGNOSIS — E669 Obesity, unspecified: Secondary | ICD-10-CM

## 2019-09-21 DIAGNOSIS — N898 Other specified noninflammatory disorders of vagina: Secondary | ICD-10-CM

## 2019-09-21 DIAGNOSIS — F17210 Nicotine dependence, cigarettes, uncomplicated: Secondary | ICD-10-CM

## 2019-09-21 NOTE — Progress Notes (Signed)
Subjective:        Sabrina Atkins is a 24 y.o. female here for a routine exam.  Current complaints: None.    Personal health questionnaire:  Is patient Ashkenazi Jewish, have a family history of breast and/or ovarian cancer: yes Is there a family history of uterine cancer diagnosed at age < 12, gastrointestinal cancer, urinary tract cancer, family member who is a Field seismologist syndrome-associated carrier: no Is the patient overweight and hypertensive, family history of diabetes, personal history of gestational diabetes, preeclampsia or PCOS: no Is patient over 44, have PCOS,  family history of premature CHD under age 79, diabetes, smoke, have hypertension or peripheral artery disease:  no At any time, has a partner hit, kicked or otherwise hurt or frightened you?: no Over the past 2 weeks, have you felt down, depressed or hopeless?: no Over the past 2 weeks, have you felt little interest or pleasure in doing things?:no   Gynecologic History No LMP recorded. Contraception: none Last Pap: 02-02-2017. Results were: normal Last mammogram: n/a. Results were: n/a  Obstetric History OB History  Gravida Para Term Preterm AB Living  1 0 0 0 1 0  SAB TAB Ectopic Multiple Live Births  0 0 0 0      # Outcome Date GA Lbr Len/2nd Weight Sex Delivery Anes PTL Lv  1 AB             Past Medical History:  Diagnosis Date  . Allergy     History reviewed. No pertinent surgical history.   Current Outpatient Medications:  .  medroxyPROGESTERone (DEPO-PROVERA) 150 MG/ML injection, Inject 1 mL (150 mg total) into the muscle every 3 (three) months. (Patient not taking: Reported on 09/21/2019), Disp: 1 mL, Rfl: 1 .  metroNIDAZOLE (FLAGYL) 500 MG tablet, Take 1 tablet (500 mg total) by mouth 2 (two) times daily. (Patient not taking: Reported on 01/06/2019), Disp: 14 tablet, Rfl: 0 .  terconazole (TERAZOL 3) 0.8 % vaginal cream, Place 1 applicator vaginally at bedtime. (Patient not taking: Reported on  08/24/2017), Disp: 20 g, Rfl: 2 No Known Allergies  Social History   Tobacco Use  . Smoking status: Current Some Day Smoker    Types: Cigars  . Smokeless tobacco: Never Used  Substance Use Topics  . Alcohol use: Not Currently    Alcohol/week: 0.0 standard drinks    Family History  Problem Relation Age of Onset  . Breast cancer Maternal Grandmother       Review of Systems  Constitutional: negative for fatigue and weight loss Respiratory: negative for cough and wheezing Cardiovascular: negative for chest pain, fatigue and palpitations Gastrointestinal: negative for abdominal pain and change in bowel habits Musculoskeletal:negative for myalgias Neurological: negative for gait problems and tremors Behavioral/Psych: negative for abusive relationship, depression Endocrine: negative for temperature intolerance    Genitourinary:negative for abnormal menstrual periods, genital lesions, hot flashes, sexual problems and vaginal discharge Integument/breast: negative for breast lump, breast tenderness, nipple discharge and skin lesion(s)    Objective:       BP 129/87   Pulse 77   Ht 5\' 1"  (1.549 m)   Wt 160 lb (72.6 kg)   BMI 30.23 kg/m  General:   alert  Skin:   no rash or abnormalities  Lungs:   clear to auscultation bilaterally  Heart:   regular rate and rhythm, S1, S2 normal, no murmur, click, rub or gallop  Breasts:   normal without suspicious masses, skin or nipple changes or axillary nodes  Abdomen:  normal findings: no organomegaly, soft, non-tender and no hernia  Pelvis:  External genitalia: normal general appearance Urinary system: urethral meatus normal and bladder without fullness, nontender Vaginal: normal without tenderness, induration or masses Cervix: normal appearance Adnexa: normal bimanual exam Uterus: anteverted and non-tender, normal size   Lab Review Urine pregnancy test Labs reviewed yes Radiologic studies reviewed no  50% of 20 min visit spent on  counseling and coordination of care.   Assessment:    Healthy female exam.    Plan:    Education reviewed: calcium supplements, depression evaluation, low fat, low cholesterol diet, safe sex/STD prevention, self breast exams, smoking cessation and weight bearing exercise. Contraception: none. Follow up in: 1 year.   No orders of the defined types were placed in this encounter.  Orders Placed This Encounter  Procedures  . Hepatitis B surface antigen  . Hepatitis C antibody  . HIV Antibody (routine testing w rflx)  . RPR    Brock Bad, MD 09/21/2019 4:32 PM

## 2019-09-21 NOTE — Progress Notes (Signed)
Pt presents for Annual Exam today.  LMP:09/11/19 lasted 5 days heavy flow first day then moderate.   Last pap: 02/02/2017  Family Hx of Breast Cancer: MGM   STD Screening: Desires Full Panel.

## 2019-09-22 LAB — CERVICOVAGINAL ANCILLARY ONLY
Bacterial Vaginitis (gardnerella): POSITIVE — AB
Candida Glabrata: NEGATIVE
Candida Vaginitis: NEGATIVE
Chlamydia: NEGATIVE
Comment: NEGATIVE
Comment: NEGATIVE
Comment: NEGATIVE
Comment: NEGATIVE
Comment: NEGATIVE
Comment: NORMAL
Neisseria Gonorrhea: NEGATIVE
Trichomonas: NEGATIVE

## 2019-09-22 LAB — HIV ANTIBODY (ROUTINE TESTING W REFLEX): HIV Screen 4th Generation wRfx: NONREACTIVE

## 2019-09-22 LAB — RPR: RPR Ser Ql: NONREACTIVE

## 2019-09-22 LAB — HEPATITIS C ANTIBODY: Hep C Virus Ab: <0.1 s/co ratio (ref 0.0–0.9)

## 2019-09-22 LAB — HEPATITIS B SURFACE ANTIGEN: Hepatitis B Surface Ag: NEGATIVE

## 2019-09-25 LAB — CYTOLOGY - PAP
Comment: NEGATIVE
Diagnosis: UNDETERMINED — AB
High risk HPV: POSITIVE — AB

## 2019-09-26 ENCOUNTER — Telehealth: Payer: Self-pay

## 2019-09-26 NOTE — Telephone Encounter (Signed)
Returned call and advised of pap results and need for colpo. Referred pt to financial assistance program for the procedure, advised that they will contact soon.

## 2019-11-02 ENCOUNTER — Other Ambulatory Visit: Payer: Self-pay

## 2019-11-02 ENCOUNTER — Ambulatory Visit: Payer: Self-pay | Admitting: *Deleted

## 2019-11-02 VITALS — BP 118/78 | Temp 98.6°F | Wt 159.6 lb

## 2019-11-02 DIAGNOSIS — Z1239 Encounter for other screening for malignant neoplasm of breast: Secondary | ICD-10-CM

## 2019-11-02 DIAGNOSIS — R87810 Cervical high risk human papillomavirus (HPV) DNA test positive: Secondary | ICD-10-CM

## 2019-11-02 NOTE — Progress Notes (Signed)
Ms. Sabrina Atkins is a 24 y.o. female who presents to [facility] clinic today with no complaints.    Pap Smear: Pap not smear completed today. Last Pap smear was 09/21/2019 at [facility] clinic and was positive for HPV and ASC-US. Per patient has no other history of an abnormal Pap smear. Last Pap smear result is available in Epic.   Physical exam: Breasts Breasts symmetrical. No skin abnormalities bilateral breasts. No nipple retraction bilateral breasts. No nipple discharge bilateral breasts. No lymphadenopathy. No lumps palpated bilateral breasts. Patient has bilateral nipple piercings. No complaints of pain or tenderness on exam.   Pelvic/Bimanual Pap is not indicated today.    Smoking History: Patient is a current smoker. Discussed smoking cessation with patient. She was referred to the [facility] Quit line and give resources to the free smoking cessation classes at [facility].   Patient Navigation: Patient education provided. Access to services provided for patient through [facility] program.    Breast and Cervical Cancer Risk Assessment: Patient has family history of breast cancer in her maternal grandmother. No known genetic mutations or history of radiation treatment to the chest before age 72. Patient does not have history of cervical dysplasia, immunocompromised, or DES exposure in-utero. Breast cancer risk assessment completed. No breast cancer risk calculated due to patient is less than 9 years old.  A: [facility] exam without pap smear No complaints today.  P: Referred patient to the [facility] clinic for a colposcopy . Appointment scheduled December 01, 2019 at 9:30am.  Mila Homer, RN, FNP student 11/02/2019 2:25 PM    Attestation of Supervision of Student:  I confirm that I have verified the information documented in the nurse practitioner student's note and that I have also personally reperformed the history, physical exam and all medical decision making activities.  I have verified that  all services and findings are accurately documented in this student's note; and I agree with management and plan as outlined in the documentation. I have also made any necessary editorial changes.  Patient has bilateral nipple piercing.  Brannock, Kathaleen Maser, RN [facility] [employer], [facility] 11/02/2019 3:05 PM

## 2019-11-02 NOTE — Patient Instructions (Addendum)
Informed Karolynn Infantino about breast self awareness. Patient did not need a Pap smear today due to last Pap smear was on 05/13/202. Explained the colposcopy the recommended follow-up for her abnormal Pap smear. Referred patient to the [facility] clinic for colposcopy. Appointment scheduled for December 01, 2019 at 9:30am. Patient aware of appointment and will be there. Let patient know a screening mammogram is recommended at age 24 unless clinically indicated prior .Discussed smoking cessation with patient. She was referred to the [facility] Quit line and give resources to the free smoking cessation classes at [facility]. Merve Futrell verbalized understanding.  Mila Homer, RN, FNP student 2:50 PM   Attestation of Supervision of Student:  I confirm that I have verified the information documented in the nurse practitioner student's note and that I have also personally reperformed the history, physical exam and all medical decision making activities.  I have verified that all services and findings are accurately documented in this student's note; and I agree with management and plan as outlined in the documentation. I have also made any necessary editorial changes.  Brannock, Kathaleen Maser, RN Center for [employer], [facility] 11/02/2019 3:06 PM

## 2019-12-01 ENCOUNTER — Encounter: Payer: Medicaid Other | Admitting: Obstetrics

## 2019-12-13 ENCOUNTER — Telehealth: Payer: Self-pay

## 2019-12-13 NOTE — Telephone Encounter (Signed)
Called patient to follow-up about missed colpo appointment with [facility]. Offered to call and reschedule appointment for patient. Patient stated that she would call to reschedule.

## 2020-01-17 ENCOUNTER — Encounter: Payer: Self-pay | Admitting: Obstetrics

## 2020-01-17 ENCOUNTER — Other Ambulatory Visit: Payer: Self-pay

## 2020-01-17 ENCOUNTER — Ambulatory Visit (INDEPENDENT_AMBULATORY_CARE_PROVIDER_SITE_OTHER): Payer: Medicaid Other | Admitting: Obstetrics

## 2020-01-17 ENCOUNTER — Other Ambulatory Visit (HOSPITAL_COMMUNITY)
Admission: RE | Admit: 2020-01-17 | Discharge: 2020-01-17 | Disposition: A | Discharge status: Home / Self Care | Payer: Medicaid Other | Source: Ambulatory Visit | Attending: Obstetrics | Admitting: Obstetrics

## 2020-01-17 VITALS — BP 130/83 | HR 65 | Ht 61.0 in | Wt 158.3 lb

## 2020-01-17 DIAGNOSIS — R87810 Cervical high risk human papillomavirus (HPV) DNA test positive: Secondary | ICD-10-CM | POA: Diagnosis present

## 2020-01-17 DIAGNOSIS — Z01812 Encounter for preprocedural laboratory examination: Secondary | ICD-10-CM

## 2020-01-17 DIAGNOSIS — F17210 Nicotine dependence, cigarettes, uncomplicated: Secondary | ICD-10-CM

## 2020-01-17 DIAGNOSIS — R87610 Atypical squamous cells of undetermined significance on cytologic smear of cervix (ASC-US): Secondary | ICD-10-CM | POA: Diagnosis not present

## 2020-01-17 DIAGNOSIS — E669 Obesity, unspecified: Secondary | ICD-10-CM

## 2020-01-17 LAB — POCT URINE PREGNANCY: Preg Test, Ur: NEGATIVE

## 2020-01-17 NOTE — Progress Notes (Signed)
Colposcopy Procedure Note  Indications: Pap smear 3 months ago showed: ASCUS with POSITIVE high risk HPV. The prior pap showed no abnormalities.  Prior cervical/vaginal disease: normal exam without visible pathology. Prior cervical treatment: no treatment.  Procedure Details  The risks and benefits of the procedure and Written informed consent obtained.  A time-out was performed confirming the patient, procedure and allergy status  Speculum placed in vagina and excellent visualization of cervix achieved, cervix swabbed x 3 with acetic acid solution.  Findings: Cervix: no visible lesions, no mosaicism, no punctation, no abnormal vasculature and acetowhite lesion(s) noted at 6, 9, 12, and 3 o'clock; SCJ visualized 360 degrees without lesions, endocervical curettage performed, cervical biopsies taken at 3,6, 9 and 12 o'clock, specimen labelled and sent to pathology and hemostasis achieved with silver nitrate.   Vaginal inspection: normal without visible lesions. Vulvar colposcopy: vulvar colposcopy not performed.   Physical Exam   Specimens: ECC and Cervical Biopsies  Complications: none.  Plan: Specimens labelled and sent to Pathology. Will base further treatment on Pathology findings. Treatment options discussed with patient. Post biopsy instructions given to patient. Return to discuss Pathology results in 1 week.   Brock Bad, MD 01/17/2020 10:36 AM

## 2020-01-17 NOTE — Progress Notes (Signed)
Pt is in the office for colpo. 

## 2020-01-18 LAB — SURGICAL PATHOLOGY

## 2020-01-19 ENCOUNTER — Telehealth: Payer: Self-pay

## 2020-01-19 NOTE — Telephone Encounter (Signed)
TC to pt to discuss recent Colpo results. Results explained to pt about LSIL and CINI and to repeat pap in 1 yr per Dr.Harper Pt voiced understanding,

## 2020-01-19 NOTE — Telephone Encounter (Signed)
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  Repeat pap in 1 year.

## 2020-01-26 ENCOUNTER — Telehealth (INDEPENDENT_AMBULATORY_CARE_PROVIDER_SITE_OTHER): Payer: Commercial Managed Care - PPO | Admitting: Obstetrics

## 2020-01-26 ENCOUNTER — Encounter: Payer: Self-pay | Admitting: Obstetrics

## 2020-01-26 DIAGNOSIS — Z712 Person consulting for explanation of examination or test findings: Secondary | ICD-10-CM

## 2020-01-26 DIAGNOSIS — N870 Mild cervical dysplasia: Secondary | ICD-10-CM

## 2020-01-26 NOTE — Progress Notes (Addendum)
TELEHEALTH GYNECOLOGY VISIT ENCOUNTER NOTE  I connected with Sabrina Atkins on 01/26/20 at  9:45 AM EDT by telephone at home and verified that I am speaking with the correct person using two identifiers.  The patient was located at home and the provider was located at [facility].   I discussed the limitations, risks, security and privacy concerns of performing an evaluation and management service by telephone and the availability of in person appointments. I also discussed with the patient that there may be a patient responsible charge related to this service. The patient expressed understanding and agreed to proceed.   History:  Sabrina Atkins is a 24 y.o. [id number] female being evaluated today for results of colposcopic evaluation. She denies any abnormal vaginal discharge, bleeding, pelvic pain or other concerns.       Past Medical History:  Diagnosis Date  . Allergy    History reviewed. No pertinent surgical history. The following portions of the patient's history were reviewed and updated as appropriate: allergies, current medications, past family history, past medical history, past social history, past surgical history and problem list.   Health Maintenance:  ASCUS pap and positive HRHPV on 09-21-2019.    Review of Systems:  Pertinent items noted in HPI and remainder of comprehensive ROS otherwise negative.  Physical Exam:   General:  Alert, oriented and cooperative.   Mental Status: Normal mood and affect perceived. Normal judgment and thought content.  Physical exam deferred due to nature of the encounter  Labs and Imaging Results for orders placed or performed in visit on 01/17/20 (from the past 336 hour(s))  Surgical pathology( [place]/ [facility])   Collection Time: 01/17/20 10:11 AM  Result Value Ref Range   SURGICAL PATHOLOGY      SURGICAL PATHOLOGY CASE: [id number] PATIENT: Sabrina Atkins Surgical Pathology Report     Clinical History: ASCUS with positive  high risk HPV (cm)   FINAL MICROSCOPIC DIAGNOSIS:  A. ENDOCERVIX, CURETTAGE: -  Predominantly blood with scant benign endocervical glandular epithelium -  No malignancy identified  B. CERVIX, BIOPSY: -  Low grade squamous intraepithelial lesion (CIN1, mild dysplasia)   GROSS DESCRIPTION:  A.  Received in formalin is blood tinged mucus that is entirely submitted in one block.  Volume: 1.7 x 0.7 x 0.2 cm (1 B)  B.  Received in formalin are tan, soft tissue fragments that are submitted in toto. Number: Multiple fragments size: 0.2 to 0.5 cm blocks: 1 (AK 01/17/2020)    Final Diagnosis performed by Manning Charity, MD.   Electronically signed 01/18/2020 Technical and / or Professional components performed at [facility]. [facility], [address], [place], [place] [address].  Immunohistochemistry Technical component (if applica ble) was performed at [facility]. [address], [address], [place], [place] [address].   IMMUNOHISTOCHEMISTRY DISCLAIMER (if applicable): Some of these immunohistochemical stains may have been developed and the performance characteristics determine by [facility]. Some may not have been cleared or approved by the U.S. Food and Drug Administration. The FDA has determined that such clearance or approval is not necessary. This test is used for clinical purposes. It should not be regarded as investigational or for research. This laboratory is certified under the Clinical Laboratory Improvement Amendments of 1988 (CLIA-88) as qualified to perform high complexity clinical laboratory testing.  The controls stained appropriately.   POCT urine pregnancy   Collection Time: 01/17/20 10:45 AM  Result Value Ref Range   Preg Test, Ur Negative  Negative   No results found.    Assessment and Plan:     1. Dysplasia of cervix, low grade (CIN 1) - repeat pap in 1 year       I discussed the assessment and treatment plan with the  patient. The patient was provided an opportunity to ask questions and all were answered. The patient agreed with the plan and demonstrated an understanding of the instructions.   The patient was advised to call back or seek an in-person evaluation/go to the ED if the symptoms worsen or if the condition fails to improve as anticipated.  I provided 15 minutes of non-face-to-face time during this encounter.   Coral Ceo, MD Center for [facility], [facility] 01/26/20

## 2020-03-13 ENCOUNTER — Encounter: Payer: Self-pay | Admitting: Obstetrics

## 2020-03-13 ENCOUNTER — Ambulatory Visit (INDEPENDENT_AMBULATORY_CARE_PROVIDER_SITE_OTHER): Payer: Commercial Managed Care - PPO | Admitting: Obstetrics

## 2020-03-13 ENCOUNTER — Other Ambulatory Visit: Payer: Self-pay

## 2020-03-13 VITALS — BP 117/82 | HR 70 | Ht 61.0 in | Wt 163.0 lb

## 2020-03-13 DIAGNOSIS — Z3009 Encounter for other general counseling and advice on contraception: Secondary | ICD-10-CM

## 2020-03-13 DIAGNOSIS — Z3202 Encounter for pregnancy test, result negative: Secondary | ICD-10-CM

## 2020-03-13 DIAGNOSIS — Z30015 Encounter for initial prescription of vaginal ring hormonal contraceptive: Secondary | ICD-10-CM | POA: Diagnosis not present

## 2020-03-13 LAB — POCT URINE PREGNANCY: Preg Test, Ur: NEGATIVE

## 2020-03-13 MED ORDER — ETONOGESTREL-ETHINYL ESTRADIOL 0.12-0.015 MG/24HR VA RING: VAGINAL_RING | VAGINAL | 12 refills | Status: DC

## 2020-03-13 NOTE — Progress Notes (Addendum)
24 y.o presents for [facility], she wants to start on NuvaRing. Last sexual intercourse was before 03/01/20.  UPT today is NEGATIVE  Last PAP 09/21/2019

## 2020-03-13 NOTE — Progress Notes (Signed)
Subjective:    Sabrina Atkins is a 24 y.o. female who presents for contraception counseling. The patient has no complaints today. The patient is sexually active. Pertinent past medical history: none.  The information documented in the HPI was reviewed and verified.  Menstrual History: OB History    Gravida  1   Para  0   Term  0   Preterm  0   AB  1   Living  0     SAB  0   TAB  0   Ectopic  0   Multiple  0   Live Births               Patient's last menstrual period was 03/01/2020 (exact date).   Patient Active Problem List   Diagnosis Date Noted  . Status post therapeutic abortion 01/06/2019  . Depo-Provera contraceptive status 01/06/2019  . 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 Allergy     History reviewed. No pertinent surgical history.   Current Outpatient Medications:  .  etonogestrel-ethinyl estradiol (NUVARING) 0.12-0.015 MG/24HR vaginal ring, Insert vaginally and leave in place for 3 consecutive weeks, then remove for 1 week., Disp: 1 each, Rfl: 12 .  metroNIDAZOLE (FLAGYL) 500 MG tablet, Take 1 tablet (500 mg total) by mouth 2 (two) times daily. (Patient not taking: Reported on 01/06/2019), Disp: 14 tablet, Rfl: 0 .  terconazole (TERAZOL 3) 0.8 % vaginal cream, Place 1 applicator vaginally at bedtime. (Patient not taking: Reported on 08/24/2017), Disp: 20 g, Rfl: 2 No Known Allergies  Social History   Tobacco Use  . Smoking status: Former Smoker    Types: Cigars  . Smokeless tobacco: Never Used  Substance Use Topics  . Alcohol use: Yes    Comment: occ    Family History  Problem Relation Age of Onset  . Breast cancer Maternal Grandmother        Review of Systems Constitutional: negative for weight loss Genitourinary:negative for abnormal menstrual periods and vaginal discharge   Objective:   BP 117/82   Pulse 70   Ht 5\' 1"  (1.549 m)   Wt 163 lb (73.9 kg)    LMP 03/01/2020 (Exact Date)   BMI 30.80 kg/m     PE: Deferred  Lab Review Urine pregnancy test Labs reviewed yes Radiologic studies reviewed no  >50% of 15 min visit spent on counseling and coordination of care.    Assessment:    24 y.o., starting NuvaRing vaginal inserts, no contraindications.   Plan:   1. Encounter for other general counseling and advice on contraception - wants to start NuvaRing inserts  2. Encounter for initial prescription of vaginal ring hormonal contraceptive Rx: - POCT urine pregnancy:  Negative - etonogestrel-ethinyl estradiol (NUVARING) 0.12-0.015 MG/24HR vaginal ring; Insert vaginally and leave in place for 3 consecutive weeks, then remove for 1 week.  Dispense: 1 each; Refill: 12   All questions answered. Contraception: NuvaRing vaginal inserts. Discussed healthy lifestyle modifications. 06-06-1973 distributed. Follow up in 7 months. Pregnancy test, result: negative.   Meds ordered this encounter  Medications  . etonogestrel-ethinyl estradiol (NUVARING) 0.12-0.015 MG/24HR vaginal ring    Sig: Insert vaginally and leave in place for 3 consecutive weeks, then remove for 1 week.    Dispense:  1 each    Refill:  12   Orders Placed This Encounter  Procedures  . POCT urine pregnancy    06-06-1973  A, MD 03/13/2020 8:59 AM

## 2020-07-23 ENCOUNTER — Ambulatory Visit: Payer: Self-pay

## 2020-07-29 ENCOUNTER — Ambulatory Visit: Payer: Commercial Managed Care - PPO

## 2020-08-12 IMAGING — CR CHEST - 2 VIEW
2 series · 2 of 2 positions shown · non-contrast
Comparison: 12/17/2014

CLINICAL DATA: Recent abortion with abdominal discomfort and
epigastric pain

EXAM:
CHEST - 2 VIEW

[w chest pa]
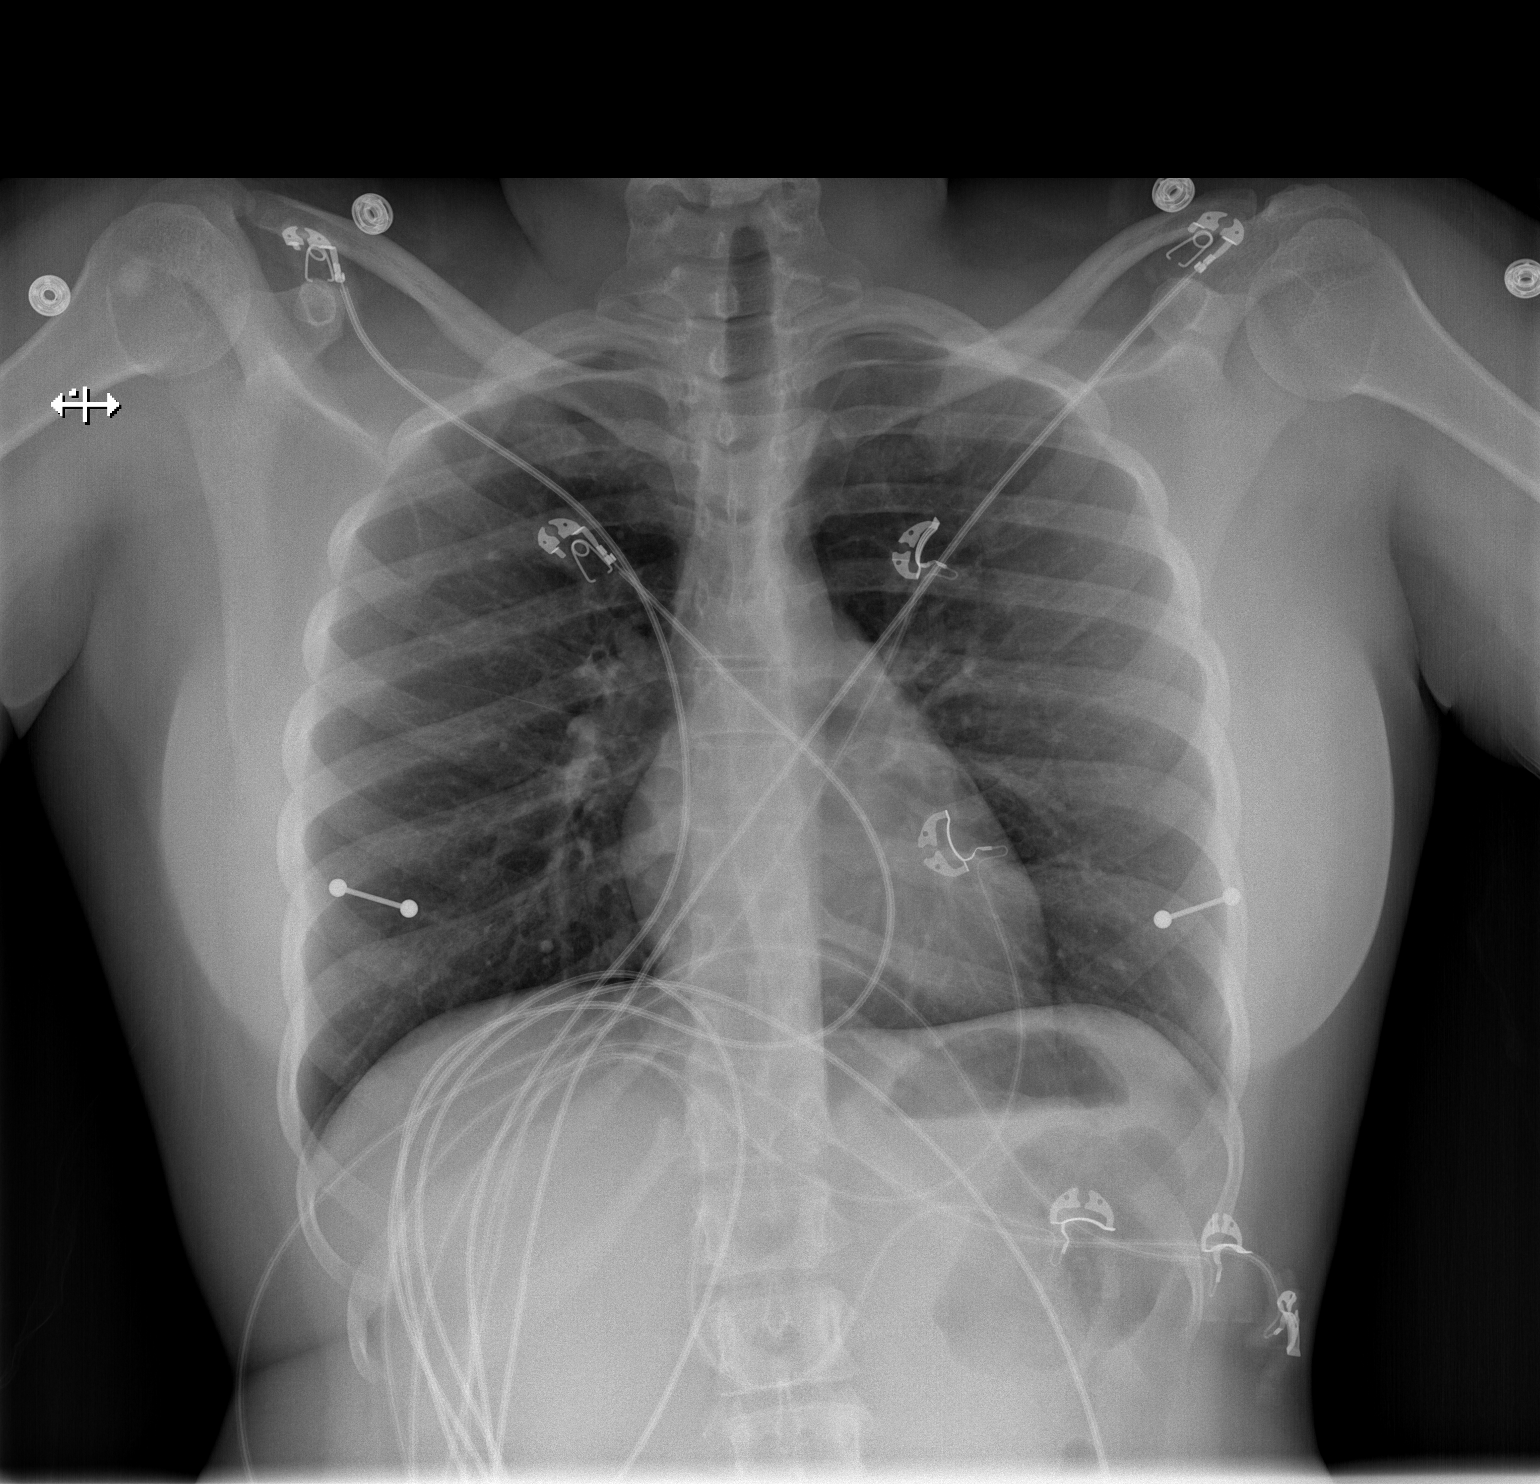

[w chest lat]
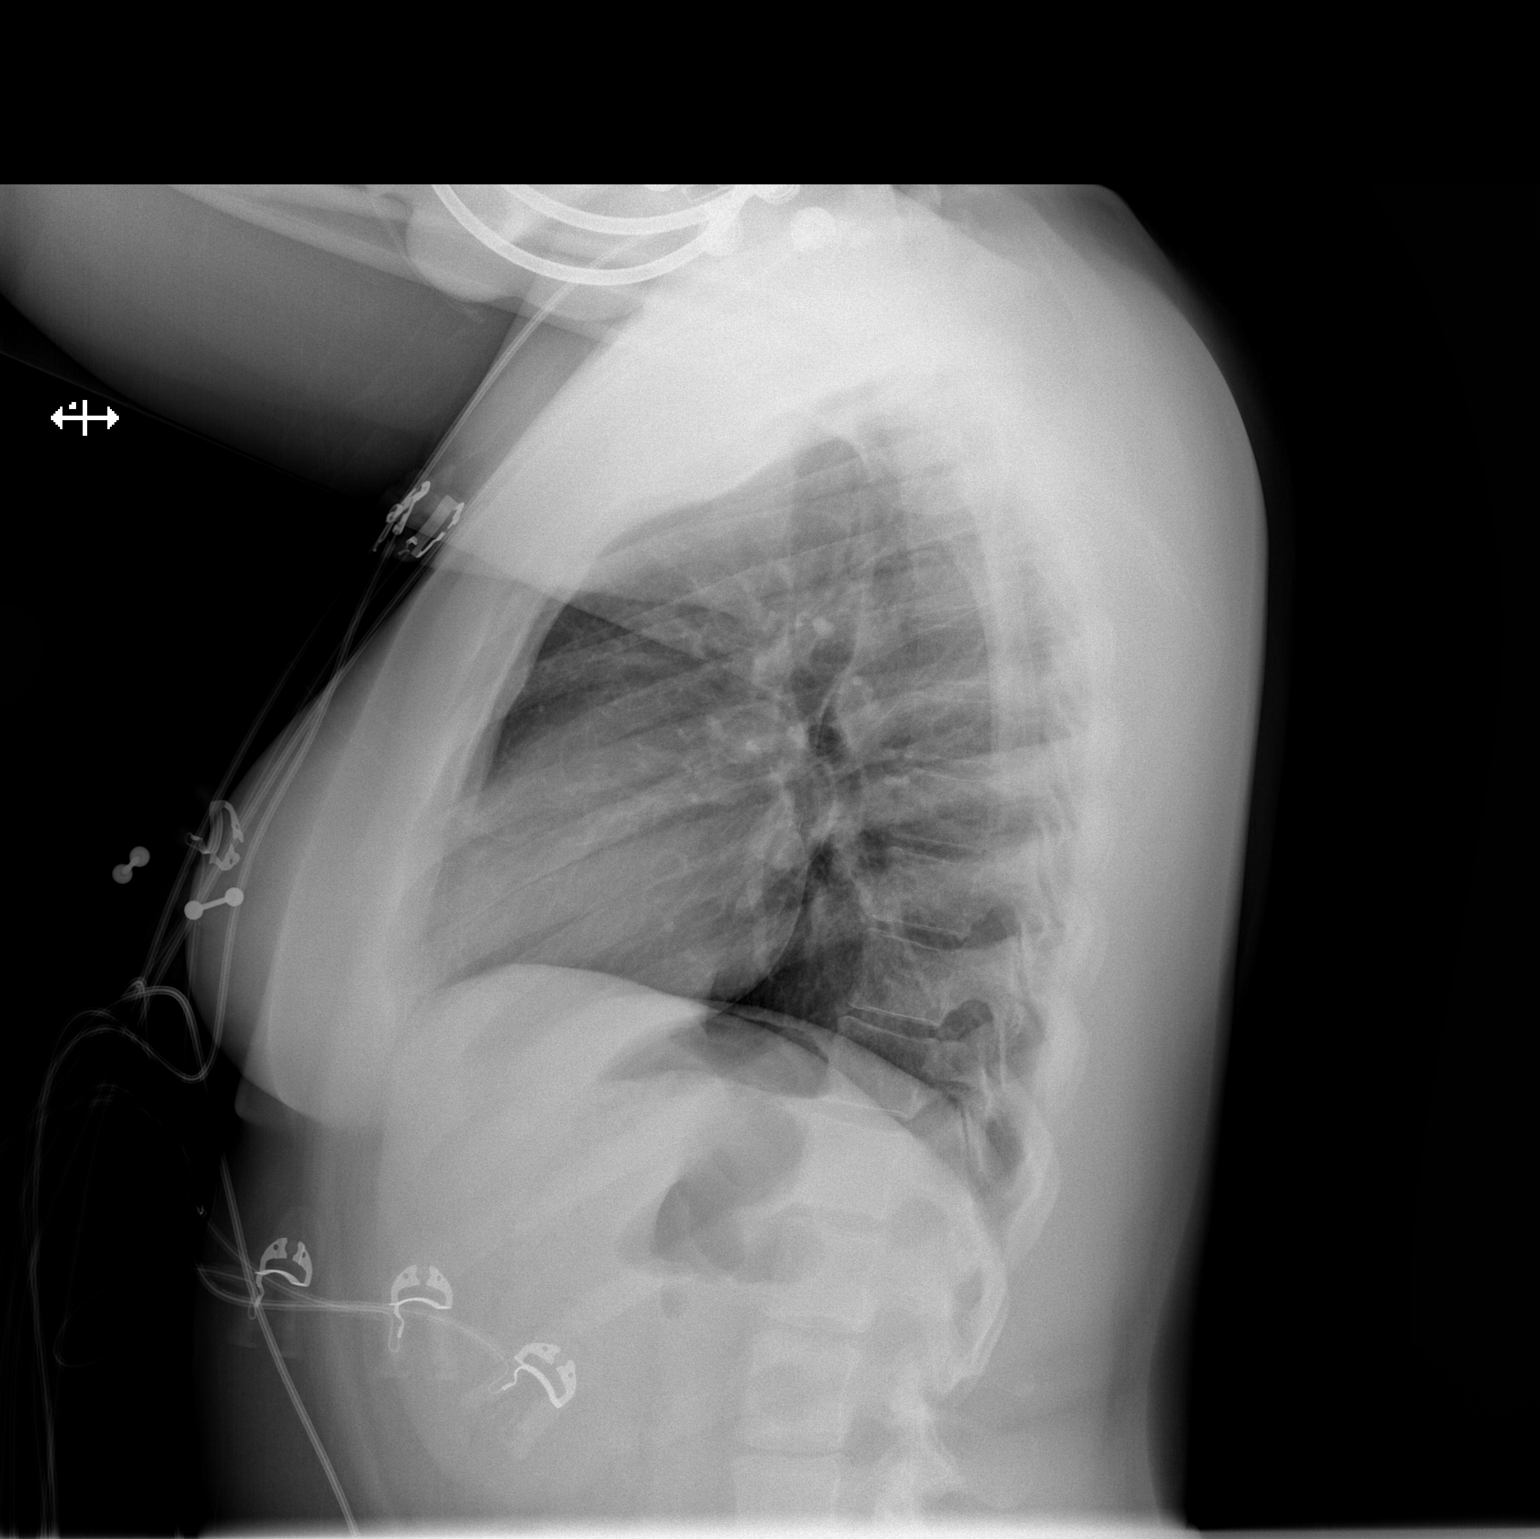

[2 of 2 positions shown; findings below may reference images not displayed]

FINDINGS: The heart size and mediastinal contours are within normal limits.
Both lungs are clear. The visualized skeletal structures are
unremarkable.
IMPRESSION: No active cardiopulmonary disease.

## 2020-08-14 ENCOUNTER — Other Ambulatory Visit: Payer: Self-pay

## 2020-08-14 ENCOUNTER — Ambulatory Visit: Payer: Self-pay | Admitting: Physician Assistant

## 2020-08-14 DIAGNOSIS — Z113 Encounter for screening for infections with a predominantly sexual mode of transmission: Secondary | ICD-10-CM

## 2020-08-14 LAB — WET PREP FOR TRICH, YEAST, CLUE
Trichomonas Exam: NEGATIVE
Yeast Exam: NEGATIVE

## 2020-08-15 ENCOUNTER — Encounter: Payer: Self-pay | Admitting: Physician Assistant

## 2020-08-15 NOTE — Progress Notes (Signed)
[facility] Department STI clinic/screening visit  Subjective:  Sabrina Atkins is a 25 y.o. female being seen today for an STI screening visit. The patient reports they do have symptoms.  Patient reports that they do not desire a pregnancy in the next year.   They reported they are not interested in discussing contraception today.  No LMP recorded.   Patient has the following medical conditions:   Patient Active Problem List   Diagnosis Date Noted  . Status post therapeutic abortion 01/06/2019  . Depo-Provera contraceptive status 01/06/2019  . 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 SEXUALLY TRANSMITTED DISEASE    screening    HPI  Patient reports that she has a "bump" in her pubic area that she thinks may be a hair bump but would like to have it checked to make sure.  Denies other symptoms, chronic conditions, surgeries and regular medicines.  Reports her last HIV test was in 2021 and that she also had her pap in 2021.  States that LMP was 07/23/2020 and she is using the NuvaRing as her current BCM.  See flowsheet for further details and programmatic requirements.    The following portions of the patient's history were reviewed and updated as appropriate: allergies, current medications, past medical history, past social history, past surgical history and problem list.  Objective:  There were no vitals filed for this visit.  Physical Exam Constitutional:      General: She is not in acute distress.    Appearance: Normal appearance.  HENT:     Head: Normocephalic and atraumatic.     Comments: No nits,lice, or hair loss. No cervical, supraclavicular or axillary adenopathy.    Mouth/Throat:     Mouth: Mucous membranes are moist.     Pharynx: Oropharynx is clear. No oropharyngeal exudate or posterior oropharyngeal erythema.  Eyes:     Conjunctiva/sclera: Conjunctivae normal.  Pulmonary:      Effort: Pulmonary effort is normal.  Abdominal:     Palpations: Abdomen is soft. There is no mass.     Tenderness: There is no abdominal tenderness. There is no guarding or rebound.  Genitourinary:    General: Normal vulva.     Rectum: Normal.     Comments: External genitalia/pubic area without nits, lice, edema, erythema, lesions and inguinal adenopathy. Vagina with normal mucosa and discharge. Cervix without visible lesions. Uterus firm, mobile, nt, no masses, no CMT, no adnexal tenderness or fullness. At the top of the left labia majora in pubic area just above clitoris there is 1 ~2 mm slightly raised, pinkish papule consistent with healed follicular bump, nt, no open lesion. Musculoskeletal:     Cervical back: Neck supple. No tenderness.  Skin:    General: Skin is warm and dry.     Findings: No bruising, erythema, lesion or rash.  Neurological:     Mental Status: She is alert and oriented to person, place, and time.  Psychiatric:        Mood and Affect: Mood normal.        Behavior: Behavior normal.        Thought Content: Thought content normal.        Judgment: Judgment normal.      Assessment and Plan:  Sabrina Atkins is a 25 y.o. female presenting to the [facility] Department for STI screening  1. Screening for STD (sexually transmitted disease) Patient into clinic  without symptoms. Reviewed wet mount results with patient and that no treatment is indicated today. Reassured patient that "bump" area is likely healing after infected hair follicle and that she is noticing some left over inflammation from that,. Rec condoms with all sex. Await test results.  Counseled that RN will call if needs to RTC for treatment once results are back. - WET PREP FOR TRICH, YEAST, CLUE - Gonococcus culture - Chlamydia/Gonorrhea [facility] - HIV/HCV [facility] - Syphilis Serology, [facility]     No follow-ups on file.  Future Appointments  Date Time Provider  Department Center  09/23/2020  8:15 AM Brock Bad, MD [facility] None    Matt Holmes, [place]

## 2020-08-19 LAB — GONOCOCCUS CULTURE

## 2020-08-20 LAB — HM HIV SCREENING LAB: HM HIV Screening: NEGATIVE

## 2020-08-20 LAB — HM HEPATITIS C SCREENING LAB: HM Hepatitis Screen: NEGATIVE

## 2020-09-23 ENCOUNTER — Ambulatory Visit: Payer: Commercial Managed Care - PPO | Admitting: Obstetrics

## 2020-12-02 ENCOUNTER — Ambulatory Visit: Payer: Commercial Managed Care - PPO | Admitting: Obstetrics

## 2021-01-22 ENCOUNTER — Encounter: Payer: Self-pay | Admitting: Obstetrics

## 2021-01-22 ENCOUNTER — Ambulatory Visit (INDEPENDENT_AMBULATORY_CARE_PROVIDER_SITE_OTHER): Payer: Commercial Managed Care - PPO | Admitting: Obstetrics

## 2021-01-22 ENCOUNTER — Other Ambulatory Visit: Payer: Self-pay

## 2021-01-22 ENCOUNTER — Other Ambulatory Visit (HOSPITAL_COMMUNITY)
Admission: RE | Admit: 2021-01-22 | Discharge: 2021-01-22 | Disposition: A | Discharge status: Home / Self Care | Payer: Commercial Managed Care - PPO | Source: Ambulatory Visit | Attending: Obstetrics | Admitting: Obstetrics

## 2021-01-22 VITALS — BP 119/81 | HR 75 | Wt 154.0 lb

## 2021-01-22 DIAGNOSIS — Z3009 Encounter for other general counseling and advice on contraception: Secondary | ICD-10-CM

## 2021-01-22 DIAGNOSIS — Z01419 Encounter for gynecological examination (general) (routine) without abnormal findings: Secondary | ICD-10-CM | POA: Diagnosis not present

## 2021-01-22 DIAGNOSIS — Z124 Encounter for screening for malignant neoplasm of cervix: Secondary | ICD-10-CM | POA: Insufficient documentation

## 2021-01-22 NOTE — Progress Notes (Signed)
Patient presents for Annual Exam.  LMP:01/07/21 cycles last 6 days heavy first few days then moderate.  Last pap:09/21/2019 Abnormal pt had Colpo.  Contraception: None and none desired   STD Screening: Declined pt has not complaints of BV or yeast.   Family Cancer : M.Grandmother .

## 2021-01-22 NOTE — Progress Notes (Signed)
Subjective:        Sabrina Atkins is a 25 y.o. female here for a routine exam.  Current complaints: None  Personal health questionnaire:  Is patient Ashkenazi Jewish, have a family history of breast and/or ovarian cancer: yes Is there a family history of uterine cancer diagnosed at age < 45, gastrointestinal cancer, urinary tract cancer, family member who is a Personnel officer syndrome-associated carrier: no Is the patient overweight and hypertensive, family history of diabetes, personal history of gestational diabetes, preeclampsia or PCOS: no Is patient over 52, have PCOS,  family history of premature CHD under age 59, diabetes, smoke, have hypertension or peripheral artery disease:  no At any time, has a partner hit, kicked or otherwise hurt or frightened you?: no Over the past 2 weeks, have you felt down, depressed or hopeless?: no Over the past 2 weeks, have you felt little interest or pleasure in doing things?:no   Gynecologic History No LMP recorded. Contraception: none Last Pap: 01-17-2020. Results were: normal Last mammogram: n/a. Results were: n/a  Obstetric History OB History  Gravida Para Term Preterm AB Living  1 0 0 0 1 0  SAB IAB Ectopic Multiple Live Births  0 0 0 0      # Outcome Date GA Lbr Len/2nd Weight Sex Delivery Anes PTL Lv  1 AB             Past Medical History:  Diagnosis Date   Allergy     History reviewed. No pertinent surgical history.   Current Outpatient Medications:    etonogestrel-ethinyl estradiol (NUVARING) 0.12-0.015 MG/24HR vaginal ring, Insert vaginally and leave in place for 3 consecutive weeks, then remove for 1 week. (Patient not taking: Reported on 01/22/2021), Disp: 1 each, Rfl: 12   metroNIDAZOLE (FLAGYL) 500 MG tablet, Take 1 tablet (500 mg total) by mouth 2 (two) times daily. (Patient not taking: Reported on 01/06/2019), Disp: 14 tablet, Rfl: 0   terconazole (TERAZOL 3) 0.8 % vaginal cream, Place 1 applicator vaginally at bedtime. (Patient  not taking: Reported on 08/24/2017), Disp: 20 g, Rfl: 2 No Known Allergies  Social History   Tobacco Use   Smoking status: Never   Smokeless tobacco: Never  Substance Use Topics   Alcohol use: Yes    Comment: occ    Family History  Problem Relation Age of Onset   Breast cancer Maternal Grandmother       Review of Systems  Constitutional: negative for fatigue and weight loss Respiratory: negative for cough and wheezing Cardiovascular: negative for chest pain, fatigue and palpitations Gastrointestinal: negative for abdominal pain and change in bowel habits Musculoskeletal:negative for myalgias Neurological: negative for gait problems and tremors Behavioral/Psych: negative for abusive relationship, depression Endocrine: negative for temperature intolerance    Genitourinary  negative for abnormal menstrual periods, genital lesions, hot flashes, sexual problems and vaginal discharge Integument/breast: negative for breast lump, breast tenderness, nipple discharge and skin lesion(s)    Objective:       BP 119/81   Pulse 75   Wt 154 lb (69.9 kg)   BMI 29.10 kg/m  General:   Alert and no distress  Skin:   no rash or abnormalities  Lungs:   clear to auscultation bilaterally  Heart:   regular rate and rhythm, S1, S2 normal, no murmur, click, rub or gallop  Breasts:   normal without suspicious masses, skin or nipple changes or axillary nodes  Abdomen:  normal findings: no organomegaly, soft, non-tender and no hernia  Pelvis:  External genitalia: normal general appearance Urinary system: urethral meatus normal and bladder without fullness, nontender Vaginal: normal without tenderness, induration or masses Cervix: normal appearance Adnexa: normal bimanual exam Uterus: anteverted and non-tender, normal size   Lab Review Urine pregnancy test Labs reviewed yes Radiologic studies reviewed yes  I have spent a total of 20 minutes of face-to-face time, excluding clinical staff  time, reviewing notes and preparing to see patient, ordering tests and/or medications, and counseling the patient.   Assessment:   1. Encounter for routine gynecological examination with Papanicolaou smear of cervix Rx: - Cytology - PAP( [place])  2. Encounter for other general counseling and advice on contraception - declines contraception     Plan:    Education reviewed: calcium supplements, depression evaluation, low fat, low cholesterol diet, safe sex/STD prevention, self breast exams, and weight bearing exercise. Contraception: none. Follow up in: 1 year.      Brock Bad, MD 01/22/2021 10:27 AM

## 2021-01-28 LAB — CYTOLOGY - PAP: Diagnosis: NEGATIVE

## 2021-01-30 ENCOUNTER — Ambulatory Visit: Payer: Commercial Managed Care - PPO

## 2021-04-29 ENCOUNTER — Other Ambulatory Visit: Payer: Self-pay

## 2021-04-29 ENCOUNTER — Ambulatory Visit: Payer: Self-pay | Admitting: Nurse Practitioner

## 2021-04-29 DIAGNOSIS — N760 Acute vaginitis: Secondary | ICD-10-CM

## 2021-04-29 DIAGNOSIS — Z113 Encounter for screening for infections with a predominantly sexual mode of transmission: Secondary | ICD-10-CM

## 2021-04-29 DIAGNOSIS — B9689 Other specified bacterial agents as the cause of diseases classified elsewhere: Secondary | ICD-10-CM

## 2021-04-29 LAB — WET PREP FOR TRICH, YEAST, CLUE
Trichomonas Exam: NEGATIVE
Yeast Exam: NEGATIVE

## 2021-04-29 MED ORDER — METRONIDAZOLE 500 MG PO TABS: 500.0000 mg | ORAL_TABLET | Freq: Two times a day (BID) | ORAL | 0 refills | Status: AC

## 2021-04-29 NOTE — Progress Notes (Signed)
[facility] Department  STI clinic/screening visit [address] [place] [address] [phone]  Subjective:  Sabrina Atkins is a 25 y.o. female being seen today for an STI screening visit. The patient reports they do not have symptoms.  Patient reports that they do not desire a pregnancy in the next year.   They reported they are not interested in discussing contraception today.    Patient's last menstrual period was 04/04/2021.   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  Patient reports being asymptomatic   Last HIV test per patient/review of record was 08/14/2020 Patient reports last pap was 01/22/2021.   Screening for MPX risk: Does the patient have an unexplained rash? No Is the patient MSM? No Does the patient endorse multiple sex partners or anonymous sex partners? No Did the patient have close or sexual contact with a person diagnosed with MPX? No Has the patient traveled outside the [place] where MPX is endemic? No Is there a high clinical suspicion for MPX-- evidenced by one of the following No  -Unlikely to be chickenpox  -Lymphadenopathy  -Rash that present in same phase of evolution on any given body part See flowsheet for further details and programmatic requirements.    The following portions of the patient's history were reviewed and updated as appropriate: allergies, current medications, past medical history, past social history, past surgical history and problem list.  Objective:  There were no vitals filed for this visit.  Physical Exam Constitutional:       Appearance: Normal appearance.  HENT:     Head: Normocephalic and atraumatic.     Mouth/Throat:     Comments: No visible signs of dental caries.  Pulmonary:     Effort: Pulmonary effort is normal.  Abdominal:     General: Abdomen is flat.     Palpations: Abdomen is soft.  Genitourinary:    General: Normal vulva.     Rectum: Normal.     Comments: External genitalia/pubic area without nits, lice, edema, erythema, lesions and inguinal adenopathy. Vagina with normal mucosa, moderate amount of discharge. Cervix without visible lesions. Uterus firm, mobile, nt, no masses, no CMT, no adnexal tenderness or fullness. pH greater than 4.5. Musculoskeletal:     Cervical back: Full passive range of motion without pain, normal range of motion and neck supple.  Lymphadenopathy:     Cervical: No cervical adenopathy.  Skin:    General: Skin is warm and dry.  Neurological:     Mental Status: She is alert and oriented to person, place, and time.  Psychiatric:        Mood and Affect: Mood normal.        Behavior: Behavior normal. Behavior is cooperative.     Assessment and Plan:  Sabrina Atkins is a 25 y.o. female presenting to the [facility] Department for STI screening  1. Screening examination for venereal disease Patient accepted all screenings including oral, vaginal CT/GC and bloodwork for HIV/RPR.  Patient meets criteria for HepB screening? No. Ordered? No - low risk Patient meets criteria for HepC screening? No. Ordered? No -  low risk    Discussed time line for State Lab results and that patient will be called with positive results and encouraged patient to call if she had not heard in 2 weeks.  Counseled to return or seek care for continued or worsening symptoms Recommended condom use with all sex  Patient is currently using condoms to prevent pregnancy.   - WET PREP FOR [place], YEAST, CLUE - HIV [place] [facility] - Syphilis Serology, [place] [facility] - Chlamydia/Gonorrhea [place]  State Lab - Gonococcus culture   2. Bacterial vaginosis -Wet mount reviewed. Please treat patient for BV today.  - metroNIDAZOLE (FLAGYL) 500 MG tablet; Take 1 tablet (500 mg total) by mouth 2 (two) times daily for 7 days.  Dispense: 14 tablet; Refill: 0     Return if symptoms worsen or fail to improve.  No future appointments.  Glenna Fellows, FNP

## 2021-04-29 NOTE — Progress Notes (Signed)
Pt here for STD testing.  Wet mount results reviewed and medication dispensed per Provider orders.  Pt given condoms. Berdie Ogren, RN

## 2021-05-03 LAB — GONOCOCCUS CULTURE

## 2021-08-04 ENCOUNTER — Ambulatory Visit: Payer: Self-pay | Admitting: Nurse Practitioner

## 2021-08-04 ENCOUNTER — Other Ambulatory Visit: Payer: Self-pay

## 2021-08-04 ENCOUNTER — Encounter: Payer: Self-pay | Admitting: Nurse Practitioner

## 2021-08-04 DIAGNOSIS — Z113 Encounter for screening for infections with a predominantly sexual mode of transmission: Secondary | ICD-10-CM

## 2021-08-04 LAB — HM HIV SCREENING LAB: HM HIV Screening: NEGATIVE

## 2021-08-04 NOTE — Progress Notes (Signed)
[facility] Department ? ?STI clinic/screening visit ?[place] [address] ?[phone] ? ?Subjective:  ?Sabrina Atkins is a 26 y.o. female being seen today for an STI screening visit. The patient reports they do not have symptoms.  Patient reports that they do not desire a pregnancy in the next year.   They reported they are not interested in discussing contraception today.   ? ?Patient's last menstrual period was 07/28/2021 (exact date). ? ? ?Patient has the following medical conditions:   ?Patient Active Problem List  ? Diagnosis Date Noted  ? Status post therapeutic abortion 01/06/2019  ? Depo-Provera contraceptive status 01/06/2019  ? 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 ? ?Chief Complaint  ?Patient presents with  ? SEXUALLY TRANSMITTED DISEASE  ?  Screening  ? ? ?HPI ? ?Patient reports to clinic today for STD screening.  Patient reports that she is currently asymptomatic.   ? ?Last HIV test per patient/review of record was 08/20/2020 ?Patient reports last pap was 09/21/2019.  ? ?Screening for MPX risk: ?Does the patient have an unexplained rash? No ?Is the patient MSM? No ?Does the patient endorse multiple sex partners or anonymous sex partners? No ?Did the patient have close or sexual contact with a person diagnosed with MPX? No ?Has the patient traveled outside the [place] where MPX is endemic? No ?Is there a high clinical suspicion for MPX-- evidenced by one of the following No ? -Unlikely to be chickenpox ? -Lymphadenopathy ? -Rash that present in same phase of evolution on any given body part ?See flowsheet for further details and programmatic requirements.  ? ? ?The following portions of the patient's history were reviewed and updated as appropriate: allergies, current medications, past medical history, past social history, past surgical history and problem list. ? ?Objective:  ?There were no vitals filed for this visit. ? ?Physical  Exam ?Constitutional:   ?   Appearance: Normal appearance.  ?HENT:  ?   Head: Normocephalic.  ?   Right Ear: External ear normal.  ?   Left Ear: External ear normal.  ?   Nose: Nose normal.  ?   Mouth/Throat:  ?   Mouth: Mucous membranes are moist.  ?   Comments: No visible signs of dental caries  ?Pulmonary:  ?   Effort: Pulmonary effort is normal.  ?Abdominal:  ?   General: Abdomen is flat.  ?   Palpations: Abdomen is soft.  ?Genitourinary: ?   Comments: External genitalia/pubic area without nits, lice, edema, erythema, lesions and inguinal adenopathy. ?Vagina with normal mucosa and discharge. ?Cervix without visible lesions. ?Uterus firm, mobile, nt, no masses, no CMT, no adnexal tenderness or fullness. pH 4.5. ?Musculoskeletal:  ?   Cervical back: Full passive range of motion without pain, normal range of motion and neck supple.  ?Skin: ?   General: Skin is warm and dry.  ?Neurological:  ?   Mental Status: She is alert and oriented to person, place, and time.  ?Psychiatric:     ?   Attention and Perception: Attention normal.     ?   Mood and Affect: Mood normal.     ?   Speech: Speech normal.     ?   Behavior: Behavior is cooperative.  ? ? ? ?Assessment and Plan:  ?Sabrina Atkins is a 26 y.o. female presenting to the [facility] Department for STI screening ? ?1. Screening examination for venereal disease ?-25 year old  female in clinic STD screening. ?-Discussed steps to prevent BV: ?Wear all-cotton underwear ?Sleep without underwear ?Take showers instead of baths ?Wear loose fitting clothing, especially during warm/hot weather ?Use a hair dryer on low after bathing to dry the area ?Avoid scented soaps and body washes ?Do not douche ?May try over the counter probiotics or boric acid gel or suppositories ?Stop smoking  ? ?-Patient accepted all screenings including oral, vaginal CT/GC and bloodwork for HIV/RPR.  ?Patient meets criteria for HepB screening? No. Ordered? No - low risk  ?Patient meets  criteria for HepC screening? No. Ordered? No-low risk  ? ?Treat wet prep per standing order ?Discussed time line for State Lab results and that patient will be called with positive results and encouraged patient to call if she had not heard in 2 weeks.  ?Counseled to return or seek care for continued or worsening symptoms ?Recommended condom use with all sex ? ?Patient is currently using  condoms  to prevent pregnancy.   ? ?- HIV [place] [facility] ?- Syphilis Serology, [place] [facility] ?- Chlamydia/Gonorrhea [place] [facility] ?- Gonococcus culture ?- WET PREP FOR [place], YEAST, CLUE ? ? ? ? ?Return if symptoms worsen or fail to improve. ? ?No future appointments. ? ?Gregary Cromer, FNP ?

## 2021-08-04 NOTE — Progress Notes (Signed)
Pt here for STD screening.  Pt declined condoms. Teiara Baria M Jhordan Kinter, RN  

## 2021-08-08 LAB — WET PREP FOR TRICH, YEAST, CLUE
Clue Cell Exam: POSITIVE — AB
Trichomonas Exam: NEGATIVE
Yeast Exam: NEGATIVE

## 2021-08-08 LAB — GONOCOCCUS CULTURE

## 2021-09-18 NOTE — Addendum Note (Signed)
Addended by: Cletis Media on: 09/18/2021 01:44 PM ? ? Modules accepted: Orders ? ?

## 2022-04-17 ENCOUNTER — Ambulatory Visit: Payer: Self-pay

## 2022-04-17 ENCOUNTER — Ambulatory Visit: Payer: Commercial Managed Care - PPO | Admitting: Family Medicine

## 2022-04-17 DIAGNOSIS — Z113 Encounter for screening for infections with a predominantly sexual mode of transmission: Secondary | ICD-10-CM

## 2022-04-17 LAB — WET PREP FOR TRICH, YEAST, CLUE
Trichomonas Exam: NEGATIVE
Yeast Exam: NEGATIVE

## 2022-04-17 NOTE — Progress Notes (Signed)
[facility] Department  STI clinic/screening visit [place] [address] [phone]  Subjective:  Shontia Gillooly is a 26 y.o. female being seen today for an STI screening visit. The patient reports they do not have symptoms.  Patient reports that they do not desire a pregnancy in the next year.   They reported they are not interested in discussing contraception today.    Patient's last menstrual period was 04/14/2022 (exact date).   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 Denies symptoms  Does the patient using douching products? No  Last HIV test per patient/review of record was  Lab Results  Component Value Date   HMHIVSCREEN Negative - Validated 08/04/2021    Lab Results  Component Value Date   HIV Non Reactive 09/21/2019   Patient reports last pap was  Lab Results  Component Value Date   DIAGPAP  01/22/2021    - Negative for intraepithelial lesion or malignancy (NILM)      Screening for MPX risk: Does the patient have an unexplained rash? No Is the patient MSM? No Does the patient endorse multiple sex partners or anonymous sex partners? No Did the patient have close or sexual contact with a person diagnosed with MPX? No Has the patient traveled outside the [place] where MPX is endemic? No Is there a high clinical suspicion for MPX-- evidenced by one of the following No  -Unlikely to be chickenpox  -Lymphadenopathy  -Rash that present in same phase of evolution on any given body part See flowsheet for further details and programmatic requirements.   Immunization history:   Immunization History  Administered Date(s) Administered   Hepatitis B, PED/ADOLESCENT 07[birth year]26-95, 07/19/1995, 03/02/1996   MMR 05/23/1996, 03/03/2000   Pneumococcal Conjugate PCV 7 03/03/2000   Tdap 01/24/2007   Varicella 05/23/1996     The following portions of the patient's history were reviewed and updated as appropriate: allergies, current medications, past medical history, past social history, past surgical history and problem list.  Objective:  There were no vitals filed for this visit.  Physical Exam Constitutional:      Appearance: Normal appearance.  HENT:     Head: Normocephalic.  Pulmonary:     Effort: Pulmonary effort is normal.  Neurological:     Mental Status: She is alert.   Realized after patient left I did not adequately assess skin and lymph system. Declined PE- self swabbed.   Assessment and Plan:  Railey Glad is a 26 y.o. female presenting to the [facility] Department for STI screening  1. Screening for venereal disease Patient accepted all screenings including oral, vaginal CT/GC and bloodwork for HIV/RPR.  Patient meets criteria for HepB screening? No. Ordered? No - not indicated Patient meets criteria for HepC screening? No. Ordered? No - not indicated  Treat wet prep per standing order Discussed time line for State Lab results and that patient will be called with positive results and encouraged patient to call if she had not heard in 2 weeks.  Counseled to return or seek care for continued or worsening symptoms Recommended condom use with all sex  Patient is currently using  female condoms  to prevent pregnancy.    - Chlamydia/Gonorrhea [place] [facility] - HIV [place] [facility] - Syphilis Serology, [place] [facility] - Gonococcus culture - WET PREP FOR [place], YEAST, CLUE  RTC with concerns or symptoms. Total time spent 20 minutes.   Sharlet Salina, [place]

## 2022-04-17 NOTE — Progress Notes (Signed)
Pt. seen for routine STI screening. Wet mount results reviewed with pt. Condoms given, no tx indicated.

## 2022-04-21 LAB — GONOCOCCUS CULTURE

## 2022-05-22 ENCOUNTER — Telehealth: Payer: Self-pay | Admitting: Family Medicine

## 2022-05-22 NOTE — Telephone Encounter (Signed)
Please give me a call to go over my test results did not have all my results given to me

## 2022-07-29 ENCOUNTER — Ambulatory Visit: Payer: Commercial Managed Care - PPO | Admitting: Advanced Practice Midwife

## 2022-07-29 ENCOUNTER — Encounter: Payer: Self-pay | Admitting: Advanced Practice Midwife

## 2022-07-29 DIAGNOSIS — Z113 Encounter for screening for infections with a predominantly sexual mode of transmission: Secondary | ICD-10-CM

## 2022-07-29 LAB — WET PREP FOR TRICH, YEAST, CLUE
Trichomonas Exam: NEGATIVE
Yeast Exam: NEGATIVE

## 2022-07-29 LAB — HM HIV SCREENING LAB: HM HIV Screening: NEGATIVE

## 2022-07-29 LAB — HM HEPATITIS C SCREENING LAB: HM Hepatitis Screen: NEGATIVE

## 2022-07-29 NOTE — Progress Notes (Signed)
[facility] Department  STI clinic/screening visit [place] [address] [phone]  Subjective:  Sabrina Atkins is a 27 y.o. SBF exvaper G1P0  female being seen today for an STI screening visit. The patient reports they do not have symptoms.  Patient reports that they do not desire a pregnancy in the next year.   They reported they are not interested in discussing contraception today.    Patient's last menstrual period was 07/07/2022 (exact date).  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   HPI  Patient reports asymptomatic. LMP 07/07/22. Last sex 07/15/22 without condom; with partner first time; 2 sex partners in last 3 mo. Last vaped 2 years ago. Last cigar 2 years ago. Last MJ 07/25/22. Last ETOH 07/25/22 (6 shots Tequila) qo weekend. Last pap 01/22/21 neg. EAB 2022 6 wks.  Does the patient using douching products? No  Last HIV test per patient/review of record was  Lab Results  Component Value Date   HMHIVSCREEN Negative - Validated 08/04/2021    Lab Results  Component Value Date   HIV Non Reactive 09/21/2019   Patient reports last pap was  Lab Results  Component Value Date   DIAGPAP  01/22/2021    - Negative for intraepithelial lesion or malignancy (NILM)   No results found for: "SPECADGYN"  Screening for MPX risk: Does the patient have an unexplained rash? No Is the patient MSM? No Does the patient endorse multiple sex partners or anonymous sex partners? Yes Did the patient have close or sexual contact with a person diagnosed with MPX? No Has the patient traveled outside the [place] where MPX is endemic? No Is there a high clinical suspicion for MPX-- evidenced by one of the  following No  -Unlikely to be chickenpox  -Lymphadenopathy  -Rash that present in same phase of evolution on any given body part See flowsheet for further details and programmatic requirements.   Immunization history:  Immunization History  Administered Date(s) Administered   Hepatitis B, PED/ADOLESCENT June 15, 1995, 07/19/1995, 03/02/1996   MMR 05/23/1996, 03/03/2000   Pneumococcal Conjugate PCV 7 03/03/2000   Tdap 01/24/2007   Varicella 05/23/1996     The following portions of the patient's history were reviewed and updated as appropriate: allergies, current medications, past medical history, past social history, past surgical history and problem list.  Objective:  There were no vitals filed for this visit.  Physical Exam Vitals and nursing note reviewed.  Constitutional:      Appearance: Normal appearance. She is normal weight.  HENT:     Head: Normocephalic and atraumatic.     Mouth/Throat:     Mouth: Mucous membranes are moist.     Pharynx: Oropharynx is clear. No oropharyngeal exudate or posterior oropharyngeal erythema.  Eyes:     Conjunctiva/sclera: Conjunctivae normal.  Pulmonary:     Effort: Pulmonary effort is normal.  Abdominal:     General: Abdomen is flat.     Palpations: Abdomen is soft. There is no mass.     Tenderness: There is no abdominal tenderness. There is no rebound.     Comments: Soft without masses or tenderness, good tone  Genitourinary:    General: Normal vulva.     Exam position:  Lithotomy position.     Pubic Area: No rash or pubic lice.      Labia:        Right: No rash or lesion.        Left: No rash or lesion.      Vagina: Vaginal discharge (large amt Ehle creamy leukorrhea, ph<4.5) present. No erythema, bleeding or lesions.     Cervix: Normal.     Uterus: Normal.      Adnexa: Right adnexa normal and left adnexa normal.     Rectum: Normal.     Comments: pH = <4.5 Lymphadenopathy:     Head:     Right side of head: No preauricular or  posterior auricular adenopathy.     Left side of head: No preauricular or posterior auricular adenopathy.     Cervical: No cervical adenopathy.     Right cervical: No superficial, deep or posterior cervical adenopathy.    Left cervical: No superficial, deep or posterior cervical adenopathy.     Upper Body:     Right upper body: No supraclavicular, axillary or epitrochlear adenopathy.     Left upper body: No supraclavicular, axillary or epitrochlear adenopathy.     Lower Body: No right inguinal adenopathy. No left inguinal adenopathy.  Skin:    General: Skin is warm and dry.     Findings: No rash.  Neurological:     Mental Status: She is alert and oriented to person, place, and time.      Assessment and Plan:  Sabrina Atkins is a 27 y.o. female presenting to the [facility] Department for STI screening  1. Screening examination for venereal disease Treat wet mount per standing orders Immunization nurse consult  - WET PREP FOR [place], YEAST, CLUE - Chlamydia/Gonorrhea [place] [facility] - Syphilis Serology, [place] [facility] - HIV/HCV [place] [facility] - Gonococcus culture   Patient accepted all screenings including oral, vaginal CT/GC and bloodwork for HIV/RPR, and wet prep. Patient meets criteria for HepB screening? Yes. Ordered? no Patient meets criteria for HepC screening? Yes. Ordered? yes  Treat wet prep per standing order Discussed time line for State Lab results and that patient will be called with positive results and encouraged patient to call if she had not heard in 2 weeks.  Counseled to return or seek care for continued or worsening symptoms Recommended repeat testing in 3 months with positive results. Recommended condom use with all sex  Patient is currently using  condoms  to prevent pregnancy.    Return if symptoms worsen or fail to improve.  No future appointments.  Herbie Saxon, CNM

## 2022-07-29 NOTE — Progress Notes (Signed)
In House Lab reviewed at visit. No treatment given. Positive for clue cells on wet prep. Patient states she has had BV previously. BV pamphlet given. Questions answered. BTHIELE RN

## 2022-08-02 LAB — GONOCOCCUS CULTURE

## 2022-08-31 ENCOUNTER — Ambulatory Visit
Admission: EM | Admit: 2022-08-31 | Discharge: 2022-08-31 | Disposition: A | Discharge status: Home / Self Care | Payer: Commercial Managed Care - PPO | Attending: Urgent Care | Admitting: Urgent Care

## 2022-08-31 DIAGNOSIS — B3731 Acute candidiasis of vulva and vagina: Secondary | ICD-10-CM | POA: Diagnosis present

## 2022-08-31 DIAGNOSIS — N898 Other specified noninflammatory disorders of vagina: Secondary | ICD-10-CM

## 2022-08-31 LAB — POCT URINALYSIS DIP (MANUAL ENTRY)
Bilirubin, UA: NEGATIVE
Blood, UA: NEGATIVE
Glucose, UA: NEGATIVE mg/dL
Ketones, POC UA: NEGATIVE mg/dL
Leukocytes, UA: NEGATIVE
Nitrite, UA: NEGATIVE
Protein Ur, POC: NEGATIVE mg/dL
Spec Grav, UA: >=1.03 — AB (ref 1.010–1.025)
Urobilinogen, UA: 1 E.U./dL
pH, UA: 5.5 (ref 5.0–8.0)

## 2022-08-31 MED ORDER — FLUCONAZOLE 150 MG PO TABS: 150.0000 mg | ORAL_TABLET | Freq: Once | ORAL | 0 refills | Status: AC

## 2022-08-31 NOTE — ED Triage Notes (Signed)
Patient presents to [facility] for vaginal itching, vaginal itching.  Self treated with OTC monistat with some relief. States she has a hx of BV, yeast, and UTIs.

## 2022-08-31 NOTE — ED Provider Notes (Addendum)
Renaldo Fiddler    CSN: [id number] Arrival date & time: 08/31/22  1831      History   Chief Complaint Chief Complaint  Patient presents with   Vaginal Discharge   Vaginal Itching    HPI Sabrina Atkins is a 27 y.o. female.    Vaginal Discharge Associated symptoms: vaginal itching   Vaginal Itching    Presents to urgent care with complaint of vaginal itching and Studzinski cheesy.  She self treated with Monistat with some relief.  Endorses history of BV, yeast, UTI.   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 No pertinent surgical history.  OB History     Gravida  1   Para  0   Term  0   Preterm  0   AB  1   Living  0      SAB  0   IAB  0   Ectopic  0   Multiple  0   Live Births               Home Medications    Prior to Admission medications   Not on File    Family History Family History  Problem Relation Age of Onset   Breast cancer Maternal Grandmother     Social History Social History   Tobacco Use   Smoking status: Former    Types: Cigars, E-cigarettes    Quit date: 02/27/2021    Years since quitting: 1.5   Smokeless tobacco: Never  Vaping Use   Vaping Use: Former  Substance Use Topics   Alcohol use: Yes    Alcohol/week: 6.0 standard drinks of alcohol    Types: 6 Shots of liquor per week    Comment: last use 07/25/22 qo weekend   Drug use: Yes    Types: Marijuana    Comment: last use 07/25/22     Allergies   Patient has no known allergies.   Review of Systems Review of Systems  Genitourinary:  Positive for vaginal discharge.     Physical Exam Triage Vital Signs ED Triage Vitals  Enc Vitals Group     BP 08/31/22 1847 133/88     Pulse Rate  08/31/22 1847 84     Resp 08/31/22 1847 16     Temp 08/31/22 1847 98.1 F (36.7 C)     Temp Source 08/31/22 1847 Temporal     SpO2 08/31/22 1847 98 %     Weight --      Height --      Head Circumference --      Peak Flow --      Pain Score 08/31/22 1841 0     Pain Loc --      Pain Edu? --      Excl. in GC? --    No data found.  Updated Vital Signs BP 133/88 (BP Location: Left Arm)   Pulse 84   Temp 98.1 F (36.7 C) (Temporal)   Resp 16   LMP 08/13/2022 (Approximate)   SpO2 98%   Visual Acuity Right Eye Distance:   Left Eye Distance:   Bilateral Distance:    Right Eye Near:   Left Eye Near:    Bilateral Near:  Physical Exam Vitals reviewed.  Constitutional:      Appearance: Normal appearance.  Skin:    General: Skin is warm and dry.  Neurological:     General: No focal deficit present.     Mental Status: She is alert and oriented to person, place, and time.  Psychiatric:        Mood and Affect: Mood normal.        Behavior: Behavior normal.      UC Treatments / Results  Labs (all labs ordered are listed, but only abnormal results are displayed) Labs Reviewed  POCT URINALYSIS DIP (MANUAL ENTRY) - Abnormal; Notable for the following components:      Result Value   Spec Grav, UA >=1.030 (*)    All other components within normal limits    EKG   Radiology No results found.  Procedures Procedures (including critical care time)  Medications Ordered in UC Medications - No data to display  Initial Impression / Assessment and Plan / UC Course  I have reviewed the triage vital signs and the nursing notes.  Pertinent labs & imaging results that were available during my care of the patient were reviewed by me and considered in my medical decision making (see chart for details).   Patient endorses cheesy discharge.  Vaginal swab result is pending.  Will treat today for Candida vaginitis.  Counseled patient on potential for adverse effects with  medications prescribed/recommended today, ER and return-to-clinic precautions discussed, patient verbalized understanding and agreement with care plan.   Final Clinical Impressions(s) / UC Diagnoses   Final diagnoses:  None   Discharge Instructions   None    ED Prescriptions   None    PDMP not reviewed this encounter.   Charma Igo, FNP 08/31/22 1903    Charma Igo, FNP 08/31/22 1904

## 2022-08-31 NOTE — Discharge Instructions (Addendum)
Follow up here or with your primary care provider if your symptoms are worsening or not improving.     

## 2022-09-01 LAB — CERVICOVAGINAL ANCILLARY ONLY
Bacterial Vaginitis (gardnerella): NEGATIVE
Candida Glabrata: NEGATIVE
Candida Vaginitis: NEGATIVE
Comment: NEGATIVE
Comment: NEGATIVE
Comment: NEGATIVE

## 2022-11-24 ENCOUNTER — Ambulatory Visit (LOCAL_COMMUNITY_HEALTH_CENTER): Payer: Commercial Managed Care - PPO | Admitting: Family Medicine

## 2022-11-24 VITALS — BP 121/80 | HR 69 | Ht 61.0 in | Wt 154.4 lb

## 2022-11-24 DIAGNOSIS — A599 Trichomoniasis, unspecified: Secondary | ICD-10-CM

## 2022-11-24 DIAGNOSIS — Z01419 Encounter for gynecological examination (general) (routine) without abnormal findings: Secondary | ICD-10-CM | POA: Diagnosis not present

## 2022-11-24 DIAGNOSIS — Z113 Encounter for screening for infections with a predominantly sexual mode of transmission: Secondary | ICD-10-CM

## 2022-11-24 DIAGNOSIS — Z3009 Encounter for other general counseling and advice on contraception: Secondary | ICD-10-CM

## 2022-11-24 LAB — WET PREP FOR TRICH, YEAST, CLUE
Trichomonas Exam: POSITIVE — AB
Yeast Exam: NEGATIVE

## 2022-11-24 LAB — HM HIV SCREENING LAB: HM HIV Screening: NEGATIVE

## 2022-11-24 MED ORDER — METRONIDAZOLE 500 MG PO TABS: 500.0000 mg | ORAL_TABLET | Freq: Two times a day (BID) | ORAL | Status: AC

## 2022-11-24 MED ORDER — ULIPRISTAL ACETATE 30 MG PO TABS
1.0000 | ORAL_TABLET | Freq: Once | ORAL | 0 refills | Status: AC
Start: 2022-11-24 — End: 2022-11-24

## 2022-11-24 MED ORDER — NORELGESTROMIN-ETH ESTRADIOL 150-35 MCG/24HR TD PTWK
1.0000 | MEDICATED_PATCH | TRANSDERMAL | 13 refills | Status: DC
Start: 2022-11-24 — End: 2023-11-04

## 2022-11-24 NOTE — Progress Notes (Signed)
The patient was dispensed metronidzaole today. I provided counseling today regarding the medication. We discussed the medication, the side effects and when to call clinic. Patient given the opportunity to ask questions. Questions answered. Gaspar Garbe, RN

## 2022-11-24 NOTE — Progress Notes (Signed)
[facility] DEPARTMENT [facility] [address] Main Number: [phone]  Family Planning Visit- Repeat Yearly Visit  Subjective:  Sabrina Atkins is a 27 y.o. G1P0010  being seen today for an annual wellness visit and to discuss contraception options.   The patient is currently using Contraceptive Patch for pregnancy prevention. Patient does not want a pregnancy in the next year.    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  Chief Complaint  Patient presents with   Annual Exam    [facility] /STI    Patient reports to clinic for PE and STI testing  Patient denies concerns about self    See flowsheet for other program required questions.   Body mass index is 29.17 kg/m. - Patient is eligible for diabetes screening based on BMI> 25 and age >35?  no HA1C ordered? not applicable  Patient reports 1 of partners in last year. Desires STI screening?  Yes   Has patient been screened once for HCV in the past?  No  No results found for: "HCVAB"  Does the patient have current of drug use, have a partner with drug use, and/or has been incarcerated since last result? No  If yes-- Screen for HCV through [facility]   Does the patient meet criteria for HBV testing? No  Criteria:  -Household, sexual or needle sharing contact with HBV -History of drug use -HIV positive -Those with known Hep C   Health Maintenance Due  Topic Date Due   DTaP/Tdap/Td (2 - Td or Tdap) 01/23/2017   COVID-19 Vaccine (1 - 2023-24 season) Never done    Review of Systems  Constitutional:  Negative for weight loss.  Eyes:  Negative for blurred vision.  Respiratory:  Negative for cough and shortness of breath.   Cardiovascular:  Negative for  claudication.  Gastrointestinal:  Negative for nausea.  Genitourinary:  Negative for dysuria and frequency.  Skin:  Negative for rash.  Neurological:  Negative for headaches.  Endo/Heme/Allergies:  Does not bruise/bleed easily.    The following portions of the patient's history were reviewed and updated as appropriate: allergies, current medications, past family history, past medical history, past social history, past surgical history and problem list. Problem list updated.  Objective:   Vitals:   11/24/22 1557  BP: 121/80  Pulse: 69  Weight: 154 lb 6.4 oz (70 kg)  Height: 5\' 1"  (1.549 m)    Physical Exam Vitals and nursing note reviewed.  Constitutional:      Appearance: Normal appearance.  HENT:     Head: Normocephalic and atraumatic.     Mouth/Throat:     Mouth: Mucous membranes are moist.     Pharynx: Oropharynx is clear. No oropharyngeal exudate or posterior oropharyngeal erythema.  Pulmonary:     Effort: Pulmonary effort is normal.  Abdominal:     General: Abdomen is flat.     Palpations: There is no mass.     Tenderness: There is no abdominal tenderness. There is no rebound.  Genitourinary:    General: Normal vulva.     Exam position: Lithotomy position.     Pubic Area: No rash or pubic lice.      Tanner stage (genital): 5.     Labia:  Right: No rash or lesion.        Left: No rash or lesion.      Vagina: Vaginal discharge present. No erythema, bleeding or lesions.     Cervix: No cervical motion tenderness, discharge, friability, lesion or erythema.     Uterus: Normal.      Adnexa: Right adnexa normal and left adnexa normal.     Rectum: Normal.     Comments: pH = 5  Thick Dunkley discharge present Lymphadenopathy:     Head:     Right side of head: No preauricular or posterior auricular adenopathy.     Left side of head: No preauricular or posterior auricular adenopathy.     Cervical: No cervical adenopathy.     Upper Body:     Right upper body: No  supraclavicular, axillary or epitrochlear adenopathy.     Left upper body: No supraclavicular, axillary or epitrochlear adenopathy.     Lower Body: No right inguinal adenopathy. No left inguinal adenopathy.  Skin:    General: Skin is warm and dry.     Findings: No rash.  Neurological:     Mental Status: She is alert and oriented to person, place, and time.     Assessment and Plan:  Sabrina Atkins is a 27 y.o. female G1P0010 presenting to the [facility] Department for an yearly wellness and contraception visit  1. Well woman exam with routine gynecological exam -last CBE 01/22/21- next due 01/23/24 -last pap 01/22/21- NILM, next due 01/23/24  2. Screening for venereal disease  - Chlamydia/Gonorrhea [place] [facility] - HIV [place] [facility] - Syphilis Serology, [place] [facility] - WET PREP FOR TRICH, YEAST, CLUE  3. Family planning  Contraception counseling: Reviewed options based on patient desire and reproductive life plan. Patient is interested in Contraceptive Patch. This was provided to the patient today.   Risks, benefits, and typical effectiveness rates were reviewed.  Questions were answered.  Written information was also given to the patient to review.    The patient will follow up in  1 years for surveillance.  The patient was told to call with any further questions, or with any concerns about this method of contraception.  Emphasized use of condoms 100% of the time for STI prevention.  Educated on ECP and assessed need for ECP. Patient was offered ECP based on Unprotected sex within past 120 hours.  Patient is within 3 days of unprotected sex. Patient was offered ECP. Reviewed options and patient desired Ella (Ulipristal)   - ulipristal acetate (ELLA) 30 MG tablet; Take 1 tablet (30 mg total) by mouth once for 1 dose.  Dispense: 1 tablet; Refill: 0 - norelgestromin-ethinyl estradiol [employer]ico) 150-35 MCG/24HR transdermal patch; Place 1 patch onto the skin once a week.  Dispense: 3  patch; Refill: 13      Return in about 1 year (around 11/24/2023) for annual well-woman exam.  Future Appointments  Date Time Provider Department Center  12/16/2022  2:10 PM Sue Lush, FNP [facility] None    Lenice Llamas, [place]

## 2022-11-24 NOTE — Addendum Note (Signed)
Addended by: Lenice Llamas on: 11/24/2022 04:56 PM   Modules accepted: Orders

## 2022-12-16 ENCOUNTER — Ambulatory Visit: Payer: Commercial Managed Care - PPO | Admitting: Obstetrics and Gynecology

## 2023-04-14 ENCOUNTER — Encounter: Payer: Self-pay | Admitting: Advanced Practice Midwife

## 2023-04-14 ENCOUNTER — Ambulatory Visit: Payer: Commercial Managed Care - PPO | Admitting: Advanced Practice Midwife

## 2023-04-14 DIAGNOSIS — A599 Trichomoniasis, unspecified: Secondary | ICD-10-CM | POA: Insufficient documentation

## 2023-04-14 DIAGNOSIS — Z113 Encounter for screening for infections with a predominantly sexual mode of transmission: Secondary | ICD-10-CM

## 2023-04-14 LAB — HM HIV SCREENING LAB: HM HIV Screening: NEGATIVE

## 2023-04-14 LAB — WET PREP FOR TRICH, YEAST, CLUE
Trichomonas Exam: POSITIVE — AB
Yeast Exam: NEGATIVE

## 2023-04-14 LAB — HM HEPATITIS C SCREENING LAB: HM Hepatitis Screen: NEGATIVE

## 2023-04-14 MED ORDER — METRONIDAZOLE 500 MG PO TABS: 500.0000 mg | ORAL_TABLET | Freq: Two times a day (BID) | ORAL | Status: AC

## 2023-04-14 NOTE — Progress Notes (Signed)
[facility] Department  STI clinic/screening visit [address] [place] [address] [phone]  Subjective:  Sabrina Atkins is a 27 y.o.SBF G1P0 exvaper female being seen today for an STI screening visit. The patient reports they do not have symptoms.  Patient reports that they do not desire a pregnancy in the next year.   They reported they are not interested in discussing contraception today.    No LMP recorded.  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 Pt have no symptoms    HPI  Patient reports asymptomatic but was told her partner has sex with males and females. LMP 03/11/23. Last sex 03/28/23 without condom; with current partner since 10/2022; 2 partners in last 3 mo. Last MJ 03/2023. Last vaped 2022. Last cigar 2022. Last pap 01/22/21 neg. Using patch for birth control  Does the patient using douching products? No  Last HIV test per patient/review of record was  Lab Results  Component Value Date   HMHIVSCREEN Negative - Validated 11/24/2022    Lab Results  Component Value Date   HIV Non Reactive 09/21/2019     Last HEPC test per patient/review of record was  Lab Results  Component Value Date   HMHEPCSCREEN Negative-Validated 07/29/2022   No components found for: "HEPC"   Last HEPB test per patient/review of record was No components found for: "HMHEPBSCREEN" No components found for: "HEPC"   Patient reports last pap was 01/22/21 Lab Results  Component Value Date   DIAGPAP  01/22/2021    - Negative for intraepithelial lesion or malignancy (NILM)   No results found for: "SPECADGYN"  Screening for MPX  risk: Does the patient have an unexplained rash? No Is the patient MSM? No Does the patient endorse multiple sex partners or anonymous sex partners? yes Did the patient have close or sexual contact with a person diagnosed with MPX? No Has the patient traveled outside the [place] where MPX is endemic? No Is there a high clinical suspicion for MPX-- evidenced by one of the following No  -Unlikely to be chickenpox  -Lymphadenopathy  -Rash that present in same phase of evolution on any given body part See flowsheet for further details and programmatic requirements.   Immunization history:  Immunization History  Administered Date(s) Administered   Hepatitis B, PED/ADOLESCENT Aug 22, 1995, 07/19/1995, 03/02/1996   MMR 05/23/1996, 03/03/2000   Pneumococcal Conjugate PCV 7 03/03/2000   Tdap 01/24/2007   Varicella 05/23/1996     The following portions of the patient's history were reviewed and updated as appropriate: allergies, current medications, past medical history, past social history, past surgical history and problem list.  Objective:  There were no vitals filed for this visit.  Physical Exam Vitals and nursing note reviewed.  Constitutional:      Appearance: Normal appearance. She is normal weight.  HENT:     Head: Normocephalic and atraumatic.     Mouth/Throat:     Mouth: Mucous membranes are moist.     Pharynx: Oropharynx is clear. No oropharyngeal exudate or posterior oropharyngeal erythema.  Eyes:     Conjunctiva/sclera: Conjunctivae normal.  Pulmonary:     Effort: Pulmonary effort  is normal.  Abdominal:     General: Abdomen is flat.     Palpations: Abdomen is soft. There is no mass.     Tenderness: There is no abdominal tenderness. There is no rebound.     Comments: Soft without masses or tenderness, good tone  Genitourinary:    General: Normal vulva.     Exam position: Lithotomy position.     Pubic Area: No rash or pubic lice.      Labia:        Right: No rash or  lesion.        Left: No rash or lesion.      Vagina: Vaginal discharge (thick Rinke sl malodorous leukorrhea, ph<4.5) present. No erythema, bleeding or lesions.     Cervix: Normal. No cervical motion tenderness, discharge, friability, lesion or erythema.     Uterus: Normal.      Adnexa: Right adnexa normal and left adnexa normal.     Rectum: Normal.     Comments: pH = <4.5 Lymphadenopathy:     Head:     Right side of head: No preauricular or posterior auricular adenopathy.     Left side of head: No preauricular or posterior auricular adenopathy.     Cervical: No cervical adenopathy.     Right cervical: No superficial, deep or posterior cervical adenopathy.    Left cervical: No superficial, deep or posterior cervical adenopathy.     Upper Body:     Right upper body: No supraclavicular, axillary or epitrochlear adenopathy.     Left upper body: No supraclavicular, axillary or epitrochlear adenopathy.     Lower Body: No right inguinal adenopathy. No left inguinal adenopathy.  Skin:    General: Skin is warm and dry.     Findings: No rash.  Neurological:     Mental Status: She is alert and oriented to person, place, and time.      Assessment and Plan:  Sabrina Atkins is a 27 y.o. female presenting to the [facility] Department for STI screening  1. Screening examination for venereal disease Treat wet mount per standing orders Immunization nurse consult  - WET PREP FOR TRICH, YEAST, CLUE - Syphilis Serology, [place] [facility] - HIV/HCV [place] [facility] - Chlamydia/Gonorrhea [place] [facility] - Gonococcus culture   Patient accepted all screenings including oral, vaginal CT/GC and bloodwork for HIV/RPR, and wet prep. Patient meets criteria for HepB screening? Yes. Ordered? no Patient meets criteria for HepC screening? Yes. Ordered? yes  Treat wet prep per standing order Discussed time line for [facility] results and that patient will be called with positive results and encouraged  patient to call if she had not heard in 2 weeks.  Counseled to return or seek care for continued or worsening symptoms Recommended repeat testing in 3 months with positive results. Recommended condom use with all sex  Patient is currently using Hormonal Contraception: Injection, Rings and Patches to prevent pregnancy.    Return if symptoms worsen or fail to improve.  No future appointments.  Alberteen Spindle, CNM

## 2023-04-14 NOTE — Progress Notes (Signed)
Pt is here for STD screening.  Wet prep results reviewed with pt. The patient was dispensed metronidazole 500 mg/ 2x/day for 7 days. I provided counseling regarding the medication. We discussed the medication, the side effects and when to call clinic. Patient given the opportunity to ask questions. Questions answered.  Condoms and packet Given.Sonda Primes, RN.

## 2023-04-21 LAB — GONOCOCCUS CULTURE

## 2023-11-04 ENCOUNTER — Encounter: Payer: Self-pay | Admitting: Nurse Practitioner

## 2023-11-04 ENCOUNTER — Encounter: Admitting: Nurse Practitioner

## 2023-11-04 ENCOUNTER — Ambulatory Visit: Admitting: Nurse Practitioner

## 2023-11-04 VITALS — BP 118/77 | Ht 62.0 in | Wt 151.0 lb

## 2023-11-04 DIAGNOSIS — Z113 Encounter for screening for infections with a predominantly sexual mode of transmission: Secondary | ICD-10-CM

## 2023-11-04 DIAGNOSIS — Z3045 Encounter for surveillance of transdermal patch hormonal contraceptive device: Secondary | ICD-10-CM

## 2023-11-04 DIAGNOSIS — Z8742 Personal history of other diseases of the female genital tract: Secondary | ICD-10-CM

## 2023-11-04 DIAGNOSIS — B3731 Acute candidiasis of vulva and vagina: Secondary | ICD-10-CM

## 2023-11-04 LAB — HM HEPATITIS C SCREENING LAB: HM Hepatitis Screen: NEGATIVE

## 2023-11-04 LAB — WET PREP FOR TRICH, YEAST, CLUE
Clue Cell Exam: NEGATIVE
Trichomonas Exam: NEGATIVE

## 2023-11-04 LAB — HM HIV SCREENING LAB: HM HIV Screening: NEGATIVE

## 2023-11-04 MED ORDER — NORELGESTROMIN-ETH ESTRADIOL 150-35 MCG/24HR TD PTWK
1.0000 | MEDICATED_PATCH | TRANSDERMAL | 13 refills | Status: DC
Start: 2023-11-04 — End: 2023-11-04

## 2023-11-04 MED ORDER — NORELGESTROMIN-ETH ESTRADIOL 150-35 MCG/24HR TD PTWK: 1.0000 | MEDICATED_PATCH | TRANSDERMAL | 13 refills | Status: AC

## 2023-11-04 MED ORDER — CLOTRIMAZOLE 1 % VA CREA: 1.0000 | TOPICAL_CREAM | Freq: Every day | VAGINAL | Status: AC

## 2023-11-04 NOTE — Progress Notes (Signed)
 Pt is here for std screening, wet prep results reviewed with pt  treatment required per standing order. The patient was dispensed #1 clotrimazole vaginal today. I provided counseling today regarding the medication. We discussed the medication, the side effects and when to call clinic. Patient given the opportunity to ask questions. Questions answered.   Condoms declined. Larraine JONELLE Northern, RN

## 2023-11-04 NOTE — Progress Notes (Signed)
 Erroneous Encounter-Disregard.  Patient encounter type changed. See separate encounter. No charge.  Clarita L. Jakorian Marengo, FNP-C

## 2023-11-04 NOTE — Progress Notes (Signed)
 [facility] DEPARTMENT [facility] [address] Sabrina Atkins [address] Main phone: [phone]  Family Planning Visit - Repeat Yearly Visit  Subjective:  Sabrina Atkins is a 28 y.o. G1P0010  being seen today for a visit for asymptomatic STI screening and to discuss contraception options. The patient is currently using female condom for pregnancy prevention. Patient does not want a pregnancy in the next year.   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 Patient reports they used patch in past and liked it, desires patch again.   Hx ASCUS with positive HPV in 2021 followed by normal colpo. Repeat pap in 2022 normal. Discussed need for repeat pap, declines today but will schedule.  See flowsheet for further details and programmatic requirements Hyperlink available at the top of the signed note in blue.  Flow sheet content below:  Sexual History How often do you have your period?: monthly Date of last sex?: 10/21/23 Has the patient had unprotected sex within the last 5 days?: No Do you have sex with men, women, both men and women?: Men only In the past 2 months how many partners have you had sex with?: 1 What ways do you have sex?: Vaginal, Oral Do you or your partner use condoms and/or dental dams every time you have vaginal, oral or anal sex?: Condoms only, Sometimes Do you douche?: No Have you ever had an STD?: Yes Have you or your partner ever shot up drugs?: No Have any of your partners used drugs in the past?: No Have you or your partners  exchanged money or drugs for sex?: No Risk Factors for Hep B Household, sexual, or needle sharing contact of a person infected with Hep B: No Sexual contact with a person who uses drugs not as prescribed?: No Currently or Ever used drugs not as prescribed: Yes HIV Positive: No PRep Patient: No Men who have sex with men: No Have Hepatitis C: No History of Incarceration: No History of Homeslessness?: No Anal sex following anal drug use?: No Risk Factors for Hep C Currently using drugs not as prescribed: Yes (MJ) Sexual partner(s) currently using drugs as not prescribed: No History of drug use: Yes (MJ) HIV Positive: No People with a history of incarceration: No People born between the years of 70 and 84: No Hepatitis Counseling Hep B Counseling: Counseled patient about increased risk of Hep B and recommendation for testing, Patient accepts testing for Hep B today Hep C Counseling: Counseled patient about increased risk of Hep C and recommendation for testing, Patient accepts testing for Hep C today Contraception Wrap Up Current Method: Female Condom End Method: Contraceptive Patch Contraception Counseling Provided: Yes How was the end contraceptive method provided?: Prescription  Diabetes screening This patient is 28 y.o. with a BMI of Body mass index is 27.62 kg/m.Sabrina Atkins  Is patient eligible for diabetes screening (age >35 and BMI >25)?  no  Was Hgb A1c ordered? no  STI screening Patient reports 1 of partners in last year.  Does this patient desire STI screening?  Yes  Hepatitis C screening Has patient been  screened once for HCV in the past?  Yes  No results found for: HCVAB  Does the patient meet criteria for HCV testing? Yes  (If yes-- Screen for HCV through [facility]) Criteria:  Since the last HCV result, does the patient have any of the following? - Current drug use - Have a partner with drug use - Has been incarcerated  Hepatitis B screening Does the patient  meet criteria for HBV testing? Yes Criteria:  -Household, sexual or needle sharing contact with HBV -History of drug use -HIV positive -Those with known Hep C  Cervical Cancer Screening  Result Date Procedure Results Follow-ups  01/22/2021 Cytology - PAP( [place]) Adequacy: Satisfactory for evaluation; transformation zone component PRESENT. Diagnosis: - Negative for intraepithelial lesion or malignancy (NILM)   01/17/2020 Surgical pathology( [place]/ [facility]) SURGICAL PATHOLOGY: SURGICAL PATHOLOGY CASE: [id number] PATIENT: Sabrina Atkins Surgical Pathology Report     Clinical History: ASCUS with positive high risk HPV (cm)   FINAL MICROSCOPIC DIAGNOSIS:  A. ENDOCERVIX, CURETTAGE: -  Predominantly blood with scant ...   09/21/2019 Cytology - PAP High risk HPV: Positive (A) Adequacy: Satisfactory for evaluation; transformation zone component PRESENT. Diagnosis: - Atypical squamous cells of undetermined significance (ASC-US ) (A) Comment: Normal Reference Range HPV - Negative   02/02/2017 Cytology - PAP Adequacy: Satisfactory for evaluation  endocervical/transformation zone component PRESENT. Diagnosis: NEGATIVE FOR INTRAEPITHELIAL LESIONS OR MALIGNANCY. BENIGN REACTIVE/REPARATIVE CHANGES. HPV 16/18/45 genotyping: NEGATIVE for HPV 16 & 18/45 HPV: DETECTED (A) Material Submitted: CervicoVaginal Pap [ThinPrep Imaged] CYTOLOGY - PAP: PAP RESULT     Health Maintenance Due  Topic Date Due   DTaP/Tdap/Td (2 - Td or Tdap) 01/23/2017   HPV VACCINES (1 - 3-dose SCDM series) Never done   COVID-19 Vaccine (1 - 2024-25 season) Never done    The following portions of the patient's history were reviewed and updated as appropriate: allergies, current medications, past family history, past medical history, past social history, past surgical history and problem list. Problem list updated.  Objective:   Vitals:   11/04/23 0908  BP: 118/77  Weight: 151 lb (68.5 kg)  Height: 5' 2  (1.575 m)    Physical Exam Nursing note reviewed.  Constitutional:      Appearance: Normal appearance.  HENT:     Head: Normocephalic.     Salivary Glands: Right salivary gland is not diffusely enlarged or tender. Left salivary gland is not diffusely enlarged or tender.     Mouth/Throat:     Lips: Pink. No lesions.     Mouth: Mucous membranes are moist.     Tongue: No lesions. Tongue does not deviate from midline.     Pharynx: Oropharynx is clear. Uvula midline. No oropharyngeal exudate or posterior oropharyngeal erythema.     Tonsils: No tonsillar exudate.   Eyes:     General:        Right eye: No discharge.        Left eye: No discharge.   Pulmonary:     Effort: Pulmonary effort is normal.  Genitourinary:    Comments: Patient asymptomatic. Declines genital exam. Self-swabbing.  Lymphadenopathy:     Head:     Right side of head: No submental, submandibular, tonsillar, preauricular or posterior auricular adenopathy.     Left side of head: No submental, submandibular, tonsillar, preauricular or posterior auricular adenopathy.     Cervical: No cervical adenopathy.     Right cervical: No superficial or posterior cervical adenopathy.    Left cervical:  No superficial or posterior cervical adenopathy.     Upper Body:     Right upper body: No supraclavicular or axillary adenopathy.     Left upper body: No supraclavicular or axillary adenopathy.   Skin:    General: Skin is warm and dry.     Findings: No rash.     Comments: Skin tone appropriate for ethnicity. Assessed exposed areas only and back.    Neurological:     Mental Status: She is alert and oriented to person, place, and time.   Psychiatric:        Attention and Perception: Attention and perception normal.        Mood and Affect: Mood and affect normal.        Speech: Speech normal.        Behavior: Behavior normal. Behavior is cooperative.        Thought Content: Thought content normal.     Assessment and Plan:   Sabrina Atkins is a 28 y.o. female G1P0010 presenting to the [facility] Department for an yearly wellness and contraception visit  Contraception counseling:  Reviewed options based on patient desire and reproductive life plan. Patient is interested in Contraceptive Patch. This was provided to the patient today.   Risks, benefits, and typical effectiveness rates were reviewed.  Questions were answered.  Written information was also given to the patient to review.    The patient will follow up in 1 year for surveillance. She additionally will make follow up appt for her pap test. The patient was told to call with any further questions, or with any concerns about this method of contraception.  Emphasized use of condoms 100% of the time for STI prevention.  Emergency Contraception Precautions (ECP): Patient assessed for need of ECP. She is not a candidate based on no intercourse for 2 weeks.   1. Screening for venereal disease (Primary)  - Chlamydia/Gonorrhea [place] [facility] - Gonococcus culture - HBV Antigen/Antibody [facility] - HIV/HCV [place] [facility] - Syphilis Serology, [place] [facility] - WET PREP FOR TRICH, YEAST, CLUE  2. Encounter for surveillance of transdermal patch hormonal contraceptive device Contraception counseling:  Reviewed options based on patient desire and reproductive life plan. Patient is interested in Contraceptive Patch. This was provided to the patient today.   Risks, benefits, and typical effectiveness rates were reviewed.  Questions were answered.  Written information was also given to the patient to review.    The patient will follow up in 1 year for surveillance. She additionally will make follow up appt for her pap test. The patient was told to call with any further questions, or with any concerns about this method of contraception.  Emphasized use of condoms 100% of the time for STI prevention.  Emergency Contraception Precautions (ECP): Patient assessed  for need of ECP. She is not a candidate based on no intercourse for 2 weeks.   - No contraindications for estrogen - norelgestromin -ethinyl estradiol  (XULANE) 150-35 MCG/24HR transdermal patch; Place 1 patch onto the skin once a week.  Dispense: 3 patch; Refill: 13  3. History of abnormal cervical Pap smear - Reviewed need for pap, patient to schedule   Return if symptoms worsen or fail to improve, for follow up as soon as able for pap test.  No future appointments.  Clarita LITTIE Narrow, NP, Supervised Damien Satchel, [facility] with exam, patient interview and documentation.

## 2023-11-04 NOTE — Progress Notes (Signed)
  Pt is here for std screening, wet prep results reviewed with pt  treatment required per standing order. The patient was dispensed #1 clotrimazole vaginal today. I provided counseling today regarding the medication. We discussed the medication, the side effects and when to call clinic. Patient given the opportunity to ask questions. Questions answered.   Condoms declined. Larraine JONELLE Northern, RN

## 2023-11-05 LAB — HBV ANTIGEN/ANTIBODY STATE LAB
Hep B Core Total Ab: NONREACTIVE
Hep B S Ab: REACTIVE
Hepatitis B Surface Antigen: NONREACTIVE

## 2023-11-09 LAB — GONOCOCCUS CULTURE

## 2023-11-17 ENCOUNTER — Encounter: Payer: Self-pay | Admitting: Nurse Practitioner
